# Patient Record
Sex: Male | Born: 1990 | Race: White | Hispanic: No | Marital: Single | Smoking: Never smoker
Health system: Southern US, Community
[De-identification: ages and names within clinical notes are randomized; demographics above are authoritative.]

## PROBLEM LIST (undated history)

## (undated) DIAGNOSIS — F329 Major depressive disorder, single episode, unspecified: Secondary | ICD-10-CM

## (undated) DIAGNOSIS — J45909 Unspecified asthma, uncomplicated: Secondary | ICD-10-CM

## (undated) DIAGNOSIS — F32A Depression, unspecified: Secondary | ICD-10-CM

## (undated) DIAGNOSIS — F419 Anxiety disorder, unspecified: Secondary | ICD-10-CM

## (undated) DIAGNOSIS — J302 Other seasonal allergic rhinitis: Secondary | ICD-10-CM

## (undated) HISTORY — DX: Anxiety disorder, unspecified: F41.9

## (undated) HISTORY — DX: Depression, unspecified: F32.A

---
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
## 2010-03-09 HISTORY — PX: APPENDECTOMY: SHX54

## 2011-01-15 ENCOUNTER — Other Ambulatory Visit: Payer: Self-pay | Admitting: Pediatrics

## 2011-01-15 ENCOUNTER — Ambulatory Visit
Admission: RE | Admit: 2011-01-15 | Discharge: 2011-01-15 | Disposition: A | Discharge status: Home / Self Care | Payer: BC Managed Care – PPO | Source: Ambulatory Visit | Attending: Pediatrics | Admitting: Pediatrics

## 2011-01-15 DIAGNOSIS — R05 Cough: Secondary | ICD-10-CM

## 2011-01-15 DIAGNOSIS — R059 Cough, unspecified: Secondary | ICD-10-CM

## 2011-04-13 ENCOUNTER — Ambulatory Visit
Admission: RE | Admit: 2011-04-13 | Discharge: 2011-04-13 | Disposition: A | Discharge status: Home / Self Care | Payer: BC Managed Care – PPO | Source: Ambulatory Visit | Attending: Pediatrics | Admitting: Pediatrics

## 2011-04-13 ENCOUNTER — Other Ambulatory Visit: Payer: Self-pay | Admitting: Pediatrics

## 2011-04-13 DIAGNOSIS — R0989 Other specified symptoms and signs involving the circulatory and respiratory systems: Secondary | ICD-10-CM

## 2012-10-10 ENCOUNTER — Ambulatory Visit
Admission: RE | Admit: 2012-10-10 | Discharge: 2012-10-10 | Disposition: A | Discharge status: Home / Self Care | Payer: BC Managed Care – PPO | Source: Ambulatory Visit | Attending: Allergy and Immunology | Admitting: Allergy and Immunology

## 2012-10-10 ENCOUNTER — Other Ambulatory Visit: Payer: Self-pay | Admitting: Allergy and Immunology

## 2012-10-10 DIAGNOSIS — J45909 Unspecified asthma, uncomplicated: Secondary | ICD-10-CM

## 2014-04-06 IMAGING — CR DG CHEST 2V
2 series · 2 of 2 positions shown · non-contrast
Comparison: Chest x-ray of 04/13/2011

CLINICAL DATA: Cough, short of breath, asthma

CHEST - 2 VIEW

[w chest pa]
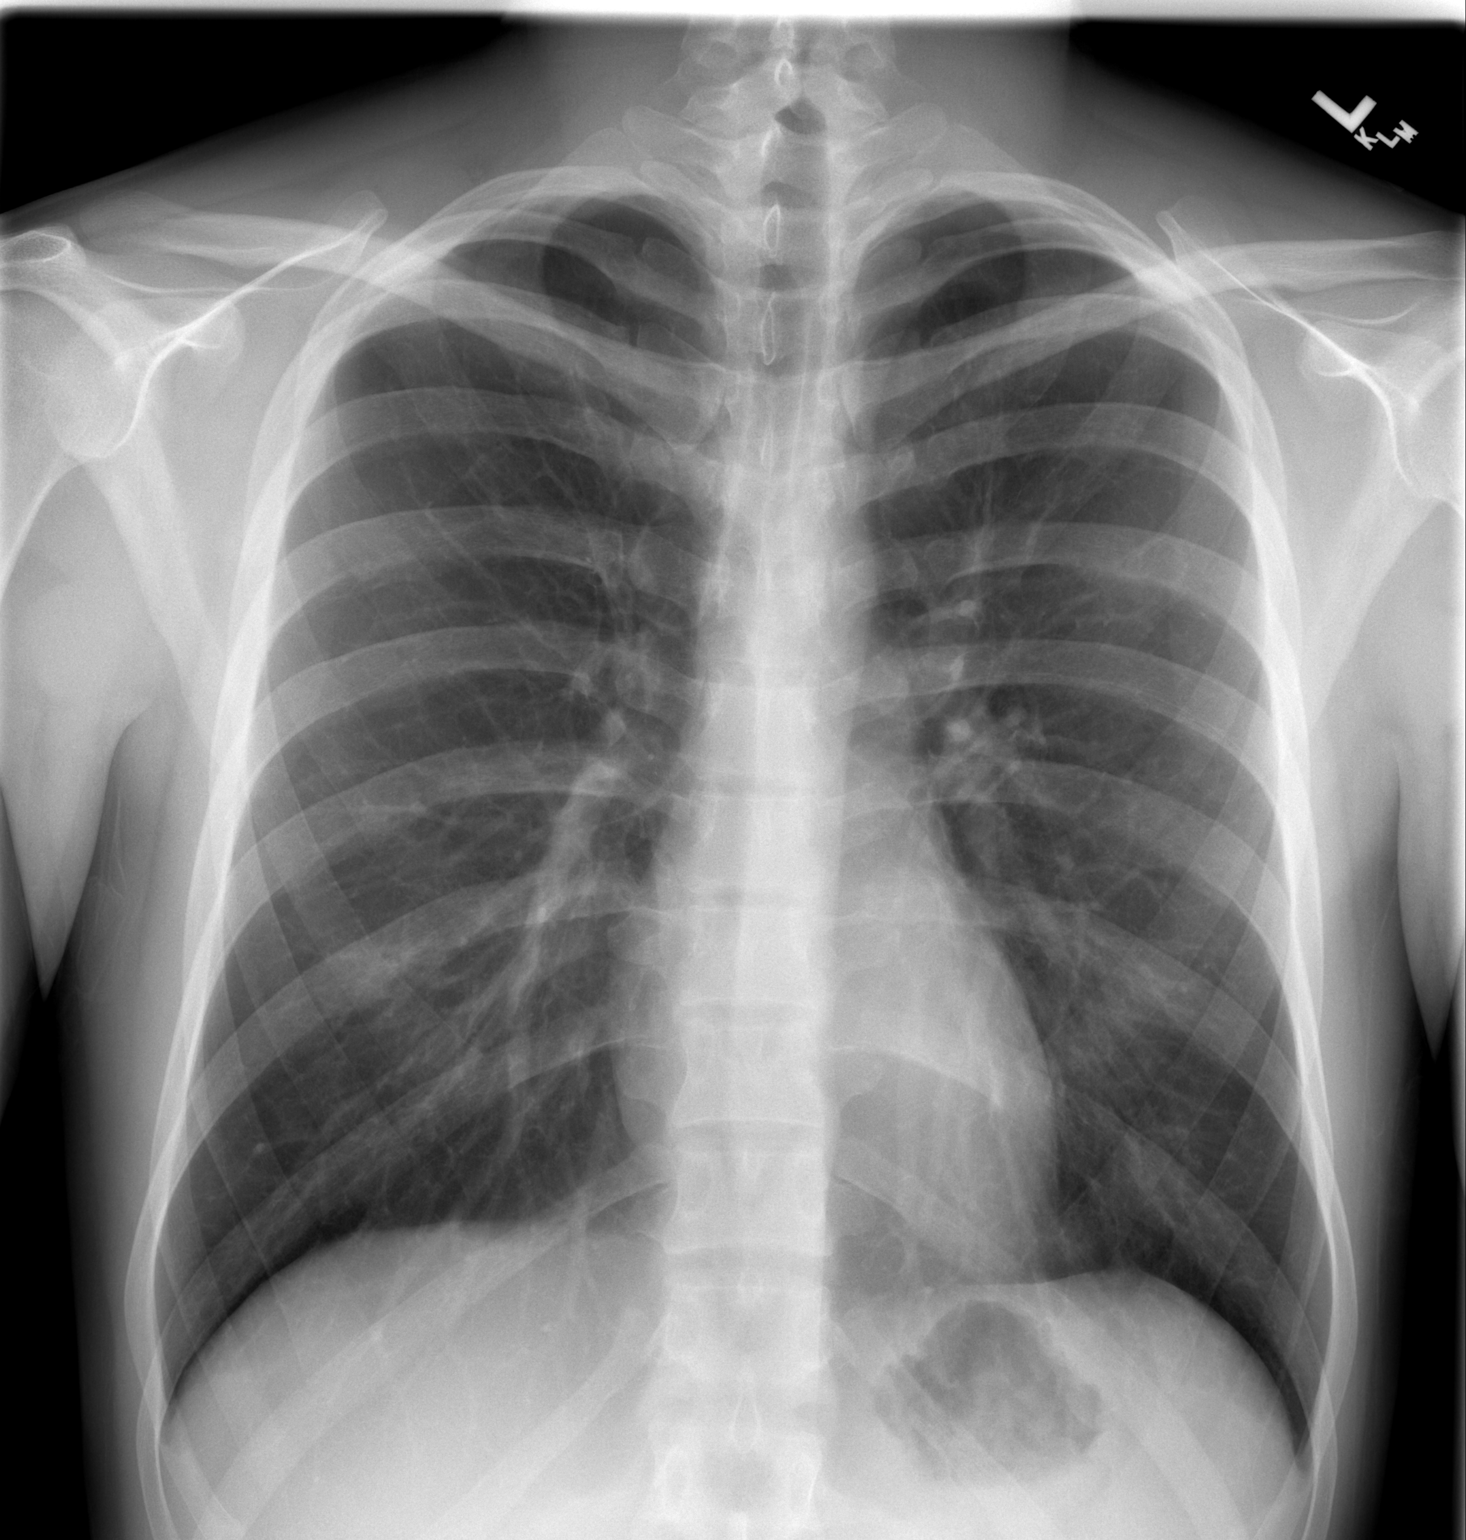

[w chest lat]
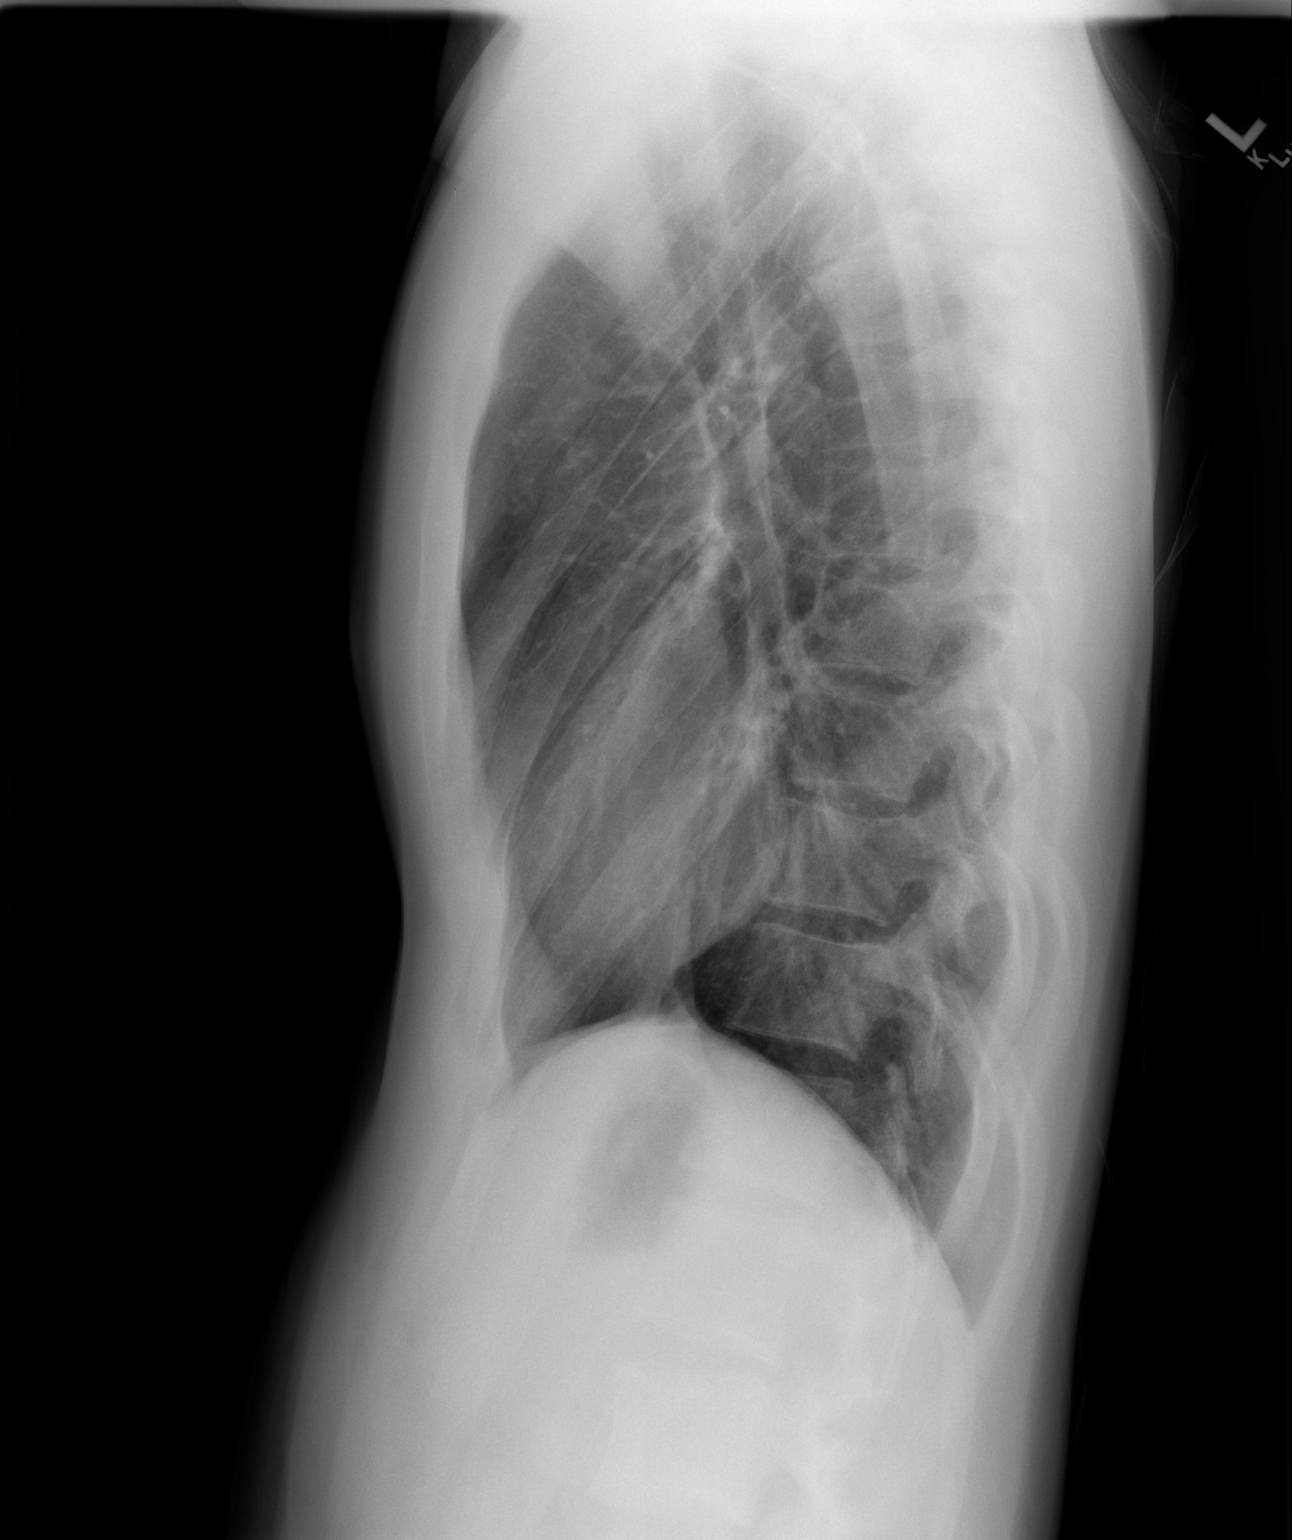

[2 of 2 positions shown; findings below may reference images not displayed]

FINDINGS: The lungs remain clear.  Mediastinal contours are stable.
No adenopathy is seen.  The heart is within normal limits in size.
No bony abnormality is noted.
IMPRESSION: No active lung disease.  Stable chest x-ray.

## 2018-11-10 ENCOUNTER — Other Ambulatory Visit: Payer: Self-pay | Admitting: Behavioral Health

## 2018-11-10 ENCOUNTER — Inpatient Hospital Stay (HOSPITAL_COMMUNITY)
Admission: RE | Admit: 2018-11-10 | Discharge: 2018-11-10 | DRG: 880 | Disposition: A | Discharge status: Home / Self Care | Payer: BLUE CROSS/BLUE SHIELD | Attending: Psychiatry | Admitting: Psychiatry

## 2018-11-10 ENCOUNTER — Other Ambulatory Visit: Payer: Self-pay

## 2018-11-10 ENCOUNTER — Encounter (HOSPITAL_COMMUNITY): Payer: Self-pay | Admitting: Behavioral Health

## 2018-11-10 DIAGNOSIS — U071 COVID-19: Secondary | ICD-10-CM | POA: Diagnosis present

## 2018-11-10 DIAGNOSIS — R45851 Suicidal ideations: Principal | ICD-10-CM | POA: Diagnosis present

## 2018-11-10 HISTORY — DX: Unspecified asthma, uncomplicated: J45.909

## 2018-11-10 HISTORY — DX: Other seasonal allergic rhinitis: J30.2

## 2018-11-10 LAB — SARS CORONAVIRUS 2 BY RT PCR (HOSPITAL ORDER, PERFORMED IN ~~LOC~~ HOSPITAL LAB): SARS Coronavirus 2: POSITIVE — AB

## 2018-11-10 MED ORDER — BUPROPION HCL ER (XL) 150 MG PO TB24: 150.0000 mg | ORAL_TABLET | Freq: Every day | ORAL | 0 refills | Status: DC

## 2018-11-10 MED ORDER — TRAZODONE HCL 50 MG PO TABS: 50.0000 mg | ORAL_TABLET | Freq: Every day | ORAL | 0 refills | Status: DC

## 2018-11-10 MED ORDER — BUPROPION HCL ER (XL) 150 MG PO TB24: 150.0000 mg | ORAL_TABLET | Freq: Every day | ORAL | Status: DC

## 2018-11-10 MED ORDER — HYDROXYZINE HCL 25 MG PO TABS: 25.0000 mg | ORAL_TABLET | Freq: Three times a day (TID) | ORAL | 0 refills | Status: DC | PRN

## 2018-11-10 MED ORDER — HYDROXYZINE HCL 25 MG PO TABS: 25.0000 mg | ORAL_TABLET | Freq: Three times a day (TID) | ORAL | Status: DC | PRN

## 2018-11-10 MED ORDER — TRAZODONE HCL 50 MG PO TABS: 50.0000 mg | ORAL_TABLET | Freq: Every day | ORAL | Status: DC

## 2018-11-10 NOTE — Tx Team (Signed)
Initial Treatment Plan 11/10/2018 5:34 PM SHARBEL SAHAGUN BOF:[id number]    PATIENT STRESSORS: Educational concerns Other: Ongoing depression and complicated relationship.   PATIENT STRENGTHS: Average or above average intelligence Capable of independent living General fund of knowledge Motivation for treatment/growth   PATIENT IDENTIFIED PROBLEMS: Depression   Anxiety  Thoughts of wanting to die but no suicidal ideation.  "I wish I was dead but don't want to hurt myself, I didn't think that I would be admitted."  "Start on some antidepressants"             DISCHARGE CRITERIA:  Improved stabilization in mood, thinking, and/or behavior Need for constant or close observation no longer present Verbal commitment to aftercare and medication compliance  PRELIMINARY DISCHARGE PLAN: Outpatient therapy Medication management  PATIENT/FAMILY INVOLVEMENT: This treatment plan has been presented to and reviewed with the patient.  The patient and family have been given the opportunity to ask questions and make suggestions.  Windell Moment, RN 11/10/2018, 5:34 PM

## 2018-11-10 NOTE — Progress Notes (Signed)
Kirk Leon is a 28 year old make being admitted voluntarily to [facility] as a walk in to [facility].  He came in with ongoing depression with passive suicidal thoughts that have worsened in the past few weeks.  He was pleasant and cooperative.  He voiced that he just wishes something will happen and he would die but no plans to harm himself at this time.  He will seek out staff if that worsens.  Oriented him to the unit.  Admission paperwork completed and signed. Q 15 minute checks initiated for safety.  We will continue to monitor the progress towards his goals.

## 2018-11-10 NOTE — Discharge Instructions (Signed)
You COVID-19 test came back with a positive result. You are asymptomatic. You are being instructed to isolate in your home for 14 days. Please return to the emergency room if you begin to show signs or symptoms; cough, body aches, difficulty breathing, shortness of breath, sore throat, loss of taste or smell, and fever.   Follow up with Dr Casimiro Needle in 3 - 5 days. Let Dr Casimiro Needle know if you are having any difficulties with your medications  For your mental health needs, you are advised to follow up with [facility].  New and returning patients are seen at their walk-in clinic.  Walk-in hours are Monday - Friday from 8:00 am - 3:00 pm.  Walk-in patients are seen on a first come, first served basis.  Try to arrive as early as possible for he best chance of being seen the same day:       [facility]      [address]      [place], [place] [address]      [phone]

## 2018-11-10 NOTE — Discharge Summary (Addendum)
Physician Discharge Summary Note  Patient:  Kirk Leon is an 28 y.o., male MRN:  [id number] DOB:  1990-09-14 Patient phone:  [phone] (home)  Patient address:   [address] [place] [address],  Total Time spent with patient: 30 minutes  Date of Admission:  11/10/2018 Date of Discharge: 11/10/2018  Reason for Admission:  Suicidal thoughts   Principal Problem: Suicidal ideation Discharge Diagnoses: Principal Problem:   Suicidal ideation   Past Psychiatric History: Depression  Past Medical History:  Past Medical History:  Diagnosis Date  . Asthma   . Seasonal allergies    History reviewed. No pertinent surgical history. Family History: History reviewed. No pertinent family history. Family Psychiatric  History: Pt did not give this information Social History:  Social History   Substance and Sexual Activity  Alcohol Use Yes   Comment: 4-5 mixed drinks 1-2 weekly     Social History   Substance and Sexual Activity  Drug Use Yes  . Types: Marijuana   Comment: uses on occasion    Social History   Socioeconomic History  . Marital status: Single    Spouse name: Not on file  . Number of children: Not on file  . Years of education: Not on file  . Highest education level: Not on file  Occupational History  . Not on file  Social Needs  . Financial resource strain: Not on file  . Food insecurity    Worry: Not on file    Inability: Not on file  . Transportation needs    Medical: Not on file    Non-medical: Not on file  Tobacco Use  . Smoking status: Never Smoker  . Smokeless tobacco: Never Used  Substance and Sexual Activity  . Alcohol use: Yes    Comment: 4-5 mixed drinks 1-2 weekly  . Drug use: Yes    Types: Marijuana    Comment: uses on occasion  . Sexual activity: Not on file  Lifestyle  . Physical activity    Days per week: Not on file    Minutes per session: Not on file  . Stress: Not on file  Relationships  . Social Wellsite geologist on phone: Not on file    Gets together: Not on file    Attends religious service: Not on file    Active member of club or organization: Not on file    Attends meetings of clubs or organizations: Not on file    Relationship status: Not on file  Other Topics Concern  . Not on file  Social History Narrative  . Not on file    Hospital Course:   Pt presented to [facility], as a walk-in, with thoughts of self harm. Pt since is denying these thoughts and is requesting to be discharged. His COVID-19 test returned a positive result but he is asymptomatic. He will be discharged home with instructions to self-isolate for 14 days. He is depressed but wants to follow up outpatient. Pt instructed to return to emergency room if he begins to show signs or symptoms of COVID. This information has been placed in his discharge instructions along with outpatient providers. He is able to contract for safety. Of note; he is dealing with the stress of his mother being sick with leukemia.   Addendum: This Clinical research associate spoke with Pt's father about options available to Pt for outpatient treatment. [facility] outpatient information was provided. Pt had been to see Dr Donell Beers yesterday for his  initial intake and evaluation.  Dr. Donell BeersPlovsky found patient to be extremely suicidal and recommended he come to behavioral health hospital for additional evaluation.  Patient was found to be COVID positive, with no symptoms,  at which time he was denying suicidal ideation intent or plan.  This Clinical research associatewriter spoke with Dr. Donell BeersPlovsky on the phone and we came to an agreement to prescribe a short course of trazodone, Vistaril, and Wellbutrin.  Patient will return to see Dr. Donell BeersPlovsky on Tuesday for continued care.  Patient's father was educated regarding medication administration.  Patient was also educated regarding the medications Dr. Donell BeersPlovsky would like him to begin taking before his appointment on Tuesday.  Patient was discharged in stable condition  to the care of his father.  Father and patient were both given strict return precautions; [facility], the nearest emergency room or call 911.  Physical Findings: AIMS: Facial and Oral Movements Muscles of Facial Expression: None, normal Lips and Perioral Area: None, normal Jaw: None, normal Tongue: None, normal,Extremity Movements Upper (arms, wrists, hands, fingers): None, normal Lower (legs, knees, ankles, toes): None, normal, Trunk Movements Neck, shoulders, hips: None, normal, Overall Severity Severity of abnormal movements (highest score from questions above): None, normal Incapacitation due to abnormal movements: None, normal Patient's awareness of abnormal movements (rate only patient's report): No Awareness, Dental Status Current problems with teeth and/or dentures?: No Does patient usually wear dentures?: No  CIWA:    COWS:     Musculoskeletal: Strength & Muscle Tone: within normal limits Gait & Station: normal Patient leans: N/A  Psychiatric Specialty Exam:   Blood pressure (!) 129/92, pulse 99, temperature 98.2 F (36.8 C), temperature source Oral, resp. rate 18, SpO2 99 %.There is no height or weight on file to calculate BMI.  General Appearance: Casual  Eye Contact::  Good  Speech:  Clear and Coherent409  Volume:  Normal  Mood:  Euthymic  Affect:  Congruent  Thought Process:  Coherent and Descriptions of Associations: Intact  Orientation:  Full (Time, Place, and Person)  Thought Content:  Logical  Suicidal Thoughts:  No  Homicidal Thoughts:  No  Memory:  Immediate;   Good Recent;   Good Remote;   Fair  Judgement:  Fair  Insight:  Fair  Psychomotor Activity:  Normal  Concentration:  Good  Recall:  Good  Fund of Knowledge:Good  Language: Good  Akathisia:  Negative  Handed:  Right  AIMS (if indicated):     Assets:  ArchitectCommunication Skills Financial Resources/Insurance Housing  Sleep:     Cognition: WNL  ADL's:  Intact     Have you used any form  of tobacco in the last 30 days? (Cigarettes, Smokeless Tobacco, Cigars, and/or Pipes): No  Has this patient used any form of tobacco in the last 30 days? (Cigarettes, Smokeless Tobacco, Cigars, and/or Pipes) N/A  Blood Alcohol level:  No results found for: [facility]  Metabolic Disorder Labs:  No results found for: HGBA1C, MPG No results found for: PROLACTIN No results found for: CHOL, TRIG, HDL, CHOLHDL, VLDL, LDLCALC  See Psychiatric Specialty Exam and Suicide Risk Assessment completed by Attending Physician prior to discharge.  Discharge destination:  Home  Is patient on multiple antipsychotic therapies at discharge:  No   Has Patient had three or more failed trials of antipsychotic monotherapy by history:  No  Recommended Plan for Multiple Antipsychotic Therapies: NA   Allergies as of 11/10/2018      Reactions   Sulfa Antibiotics Hives  Medication List    You have not been prescribed any medications.      Follow-up recommendations:  Activity:  as tolerated Diet:  Heart Healthy  Comments and Treatment Plan:  Take all medications as prescribed by your outpatient provider Keep all follow-up appointments as scheduled. Outpatient mental health providers have been place din your discharge instructions, please make an appointment for your depression for medication management.  Do not consume alcohol or use illegal drugs while on prescription medications. Report any adverse effects from your medications to your primary care provider promptly.  In the event of recurrent symptoms or worsening symptoms, call 911, a crisis hotline, or go to the nearest emergency department for evaluation.   Signed: Ethelene Hal, NP 11/10/2018, 6:41 PM
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
## 2018-11-10 NOTE — H&P (Signed)
Behavioral Health Medical Screening Exam  Kirk Leon is an 28 y.o. male.who presents to [facility] as a walk-in, voluntarily. Patient was referred to [facility] by his outpatient psychiatrist Dr. Gwenlyn Perking.  He at current endorses worsening depression and suicidal thoughts. He denies plan or intent although reports he, almost daily, think about, " life would be easier if I wasn't alive." He reports being diagnosed with depression 10 years ago. Describes current depressive symptoms as  Feelings of hopelessness, worthlessness, decreased energy and concentration, fluctuations in sleeping hours, and decreased appetite.  He states his most recent stressors are starting online law school and this past Saturday, a break-up with his girlfriend. Reports he flew out to [place] last Friday where his girlfriend resides and even on his flight there, he had thoughts of, " I wish the plane would crash."  He states, " at night I am always hoping that I don't wake up the next morning."  He endorses anxiety described as excessive worry. He denies any history of a panic disorder.  He denies homicidal ideations or psychosis. Reports having two therapist, one in [place], [place], and another in [place]. He reports he has struggled with, " off and on" suicidal thoughts for the past 10 years.. He denies any previous suicide attempts or self harming behaviors. Reports smoking marijuana weekly, occasional use of alcohol, and using "molly."  Reports he has not used molly in one month. He denies other substance abuse or use. He states he has never been on any psychotropic medications although reports he is open to starting.   Total Time spent with patient: 20 minutes  Psychiatric Specialty Exam: Physical Exam  Vitals reviewed. Constitutional: He is oriented to person, place, and time.  Neurological: He is alert and oriented to person, place, and time.    Review of Systems  Psychiatric/Behavioral: Positive for depression and suicidal ideas.  Negative for hallucinations. The patient is nervous/anxious and has insomnia.     There were no vitals taken for this visit.There is no height or weight on file to calculate BMI.  General Appearance: Fairly Groomed  Eye Contact:  Good  Speech:  Clear and Coherent and Normal Rate  Volume:  Decreased  Mood:  Anxious, Depressed, Hopeless and Worthless  Affect:  Depressed and Flat  Thought Process:  Coherent, Goal Directed, Linear and Descriptions of Associations: Intact  Orientation:  Full (Time, Place, and Person)  Thought Content:  Logical  Suicidal Thoughts:  Yes.  without intent/plan  Homicidal Thoughts:  No  Memory:  Immediate;   Fair Recent;   Fair  Judgement:  Fair  Insight:  Fair  Psychomotor Activity:  Normal  Concentration: Concentration: Fair and Attention Span: Fair  Recall:  [employer] of Knowledge:Fair  Language: Good  Akathisia:  NA  Handed:  Right  AIMS (if indicated):     Assets:  Communication Skills Desire for Improvement Resilience Social Support  Sleep:       Musculoskeletal: Strength & Muscle Tone: within normal limits Gait & Station: normal Patient leans: N/A  There were no vitals taken for this visit.  Recommendations:  Based on my evaluation the patient does not appear to have an emergency medical condition.   There is evidence of imminent risk to self at present.   Patient does meet criteria for psychiatric inpatient admission which is recommended at this time.  Mordecai Maes, NP 11/10/2018, 1:19 PM

## 2018-11-10 NOTE — [facility] Suicide Risk Assessment (Cosign Needed)
Suicide Risk Assessment  Discharge Assessment   [facility] Discharge Suicide Risk Assessment   Principal Problem: Suicidal ideation Discharge Diagnoses: Principal Problem:   Suicidal ideation   Total Time spent with patient: 30 minutes  Musculoskeletal: Strength & Muscle Tone: within normal limits Gait & Station: normal Patient leans: N/A  Psychiatric Specialty Exam:   Blood pressure (!) 129/92, pulse 99, temperature 98.2 F (36.8 C), temperature source Oral, resp. rate 18, SpO2 99 %.There is no height or weight on file to calculate BMI.  General Appearance: Casual  Eye Contact::  Good  Speech:  Clear and Coherent409  Volume:  Normal  Mood:  Euthymic  Affect:  Congruent  Thought Process:  Coherent and Descriptions of Associations: Intact  Orientation:  Full (Time, Place, and Person)  Thought Content:  Logical  Suicidal Thoughts:  No  Homicidal Thoughts:  No  Memory:  Immediate;   Good Recent;   Good Remote;   Fair  Judgement:  Fair  Insight:  Fair  Psychomotor Activity:  Normal  Concentration:  Good  Recall:  Good  Fund of Knowledge:Good  Language: Good  Akathisia:  Negative  Handed:  Right  AIMS (if indicated):     Assets:  Agricultural consultant Housing  Sleep:     Cognition: WNL  ADL's:  Intact   Mental Status Per Nursing Assessment::   On Admission: Pt presented to [facility], as a walk-in, with thoughts of self harm. Pt since is denying these thoughts and is requesting to be discharged. His COVID-19 test returned a positive result but he is asymptomatic. He will be discharged home with instructions to self-isolate for 14 days. He is depressed but wants to follow up outpatient. Pt instructed to return to emergency room if he begins to show signs or symptoms of COVID. This information has been placed in his discharge instructions along with outpatient providers. He is able to contract for safety. Of note; he is dealing with the stress of his  mother being sick with leukemia.   Demographic Factors:  Male and Adolescent or young adult  Loss Factors: Financial problems/change in socioeconomic status  Historical Factors: Family history of mental illness or substance abuse  Risk Reduction Factors:   Sense of responsibility to family  Continued Clinical Symptoms:  Depression:   Impulsivity  Cognitive Features That Contribute To Risk:  Closed-mindedness    Suicide Risk:  Minimal: No identifiable suicidal ideation.  Patients presenting with no risk factors but with morbid ruminations; may be classified as minimal risk based on the severity of the depressive symptoms    Plan Of Care/Follow-up recommendations:  Activity:  as tolerated Diet:  Heart healthy  Ethelene Hal, NP 11/10/2018, 6:33 PM

## 2018-11-11 ENCOUNTER — Telehealth (HOSPITAL_COMMUNITY): Payer: Self-pay | Admitting: Psychiatry

## 2018-11-11 ENCOUNTER — Telehealth: Payer: Self-pay | Admitting: *Deleted

## 2018-11-11 ENCOUNTER — Telehealth (HOSPITAL_COMMUNITY): Payer: Self-pay | Admitting: Professional

## 2018-11-11 NOTE — Telephone Encounter (Signed)
D:  Pt's father called and left vm for someone to call him as soon as possible.  States he was trying to reach [employer].  A:  Returned pt's father call.  According to father, pt was seen by Dr. Casimiro Needle yesterday and sent to TTS for admission.  Apparently, after assessment, he was going to be admitted for 3-5 days; but tested positive for COVID.  Mr. Winchell said he was able to talk the staff into letting him take his son (pt) home to be in quarantine.  States he was given two prescriptions for his son.  Pt's father is inquiring about virtual groups.  A:  Will contact Beather Arbour, RNC and PHP.

## 2018-11-11 NOTE — Telephone Encounter (Signed)
Contacted by Helene Kelp, RN from [facility] Dept requesting contact information; pt address on file given: [address] [place], [place], and POC Kermit Arnette (father) [phone]; she verbalized understanding.

## 2018-11-11 NOTE — Telephone Encounter (Signed)
Cln called pt to discuss PHP. Cln orients pt to PHP and pt reports wanting to move forward with an ax. Pt states "I've been depressed for 10 years, never been on anti-depressants before today. I'm exhausted from pretending I'm ok. It goes in waves but I've had a lot happen recently. I just feel like I can't go anymore." Pt reports Some substance use: I smoke marijuana socially 1-2x a week; drink socially 2x a week 3-5 beers or cocktails; extacy 1.5 months ago and Cloyde Reams; tried shrooms (09/09/17) and acid (2014) only 1 time. No current plan/intent but endorses passive SI; "I would be OK not waking up." No HI/AVH. Pt identifies mother, father, other family, and friends as safety factors. Pt reports he has an apt with Dr. Casimiro Needle on Tuesday. Cln explains dual treatment and insurance may not cover both appointments and offers apt on Wednesday. Pt accepts apt on Wednesday and changes mind to do apt on Tuesday at 2. Paperwork will be emailed to [email or web address] . Cln informs pt that [name] will complete ax and pw needs to be send back to both clns. Pt understands.

## 2018-11-15 ENCOUNTER — Other Ambulatory Visit: Payer: Self-pay

## 2018-11-15 ENCOUNTER — Other Ambulatory Visit (HOSPITAL_COMMUNITY): Payer: BLUE CROSS/BLUE SHIELD | Attending: Licensed Clinical Social Worker | Admitting: Licensed Clinical Social Worker

## 2018-11-15 DIAGNOSIS — Z658 Other specified problems related to psychosocial circumstances: Secondary | ICD-10-CM | POA: Diagnosis not present

## 2018-11-15 DIAGNOSIS — J45909 Unspecified asthma, uncomplicated: Secondary | ICD-10-CM | POA: Insufficient documentation

## 2018-11-15 DIAGNOSIS — Z818 Family history of other mental and behavioral disorders: Secondary | ICD-10-CM | POA: Diagnosis not present

## 2018-11-15 DIAGNOSIS — R4589 Other symptoms and signs involving emotional state: Secondary | ICD-10-CM | POA: Insufficient documentation

## 2018-11-15 DIAGNOSIS — Z882 Allergy status to sulfonamides status: Secondary | ICD-10-CM | POA: Diagnosis not present

## 2018-11-15 DIAGNOSIS — F411 Generalized anxiety disorder: Secondary | ICD-10-CM | POA: Diagnosis not present

## 2018-11-15 DIAGNOSIS — Z79899 Other long term (current) drug therapy: Secondary | ICD-10-CM | POA: Insufficient documentation

## 2018-11-15 DIAGNOSIS — R45851 Suicidal ideations: Secondary | ICD-10-CM | POA: Insufficient documentation

## 2018-11-15 DIAGNOSIS — F332 Major depressive disorder, recurrent severe without psychotic features: Secondary | ICD-10-CM | POA: Insufficient documentation

## 2018-11-15 NOTE — Psych (Signed)
Virtual Visit via Video Note  I connected with Kirk Leon on 11/15/18 at  2:00 PM EDT by a video enabled telemedicine application and verified that I am speaking with the correct person using two identifiers.   I discussed the limitations of evaluation and management by telemedicine and the availability of in person appointments. The patient expressed understanding and agreed to proceed.  I discussed the assessment and treatment plan with the patient. The patient was provided an opportunity to ask questions and all were answered. The patient agreed with the plan and demonstrated an understanding of the instructions.   The patient was advised to call back or seek an in-person evaluation if the symptoms worsen or if the condition fails to improve as anticipated.  I provided 75 minutes of non-face-to-face time during this encounter.   Donia GuilesJenny Jerine Surles, LCSW  Comprehensive Clinical Assessment (CCA) Note  11/15/2018 Kirk Abundrew J Biddinger [id number]  Visit Diagnosis:      ICD-10-CM   1. Severe episode of recurrent major depressive disorder, without psychotic features (HCC)  F33.2   2. GAD (generalized anxiety disorder)  F41.1       CCA Part One  Part One has been completed on paper by the patient.  (See scanned document in Chart Review)  CCA Part Two A  Intake/Chief Complaint:  CCA Intake With Chief Complaint CCA Part Two Date: 11/15/18 CCA Part Two Time: 1400 Chief Complaint/Presenting Problem: [facility] the recommendation of psychiatrist, Dr Kirk Leon. Pt states he expressed suicidal thoughts to his father, wishes that the plane pt he was due to be on would crash and he would die. Pt was [place] from [place] to where [place] in [place]. Pt reports his father had him fly home to [place] instead and his father arranged for him to see the psychiatrist. Pt reports "a build up of things" over the past month and a half led to where he is now. Pt  shares he went [place] and felt "truly happy" consistently for the first time in approximately 10 years. Pt reports he had been feeling "numb" and confusing it for "being okay" and the trip showed him he was wrong. Pt reports conflicting issues with an off and on again girlfriend. Pt identifies that when he had his trip revelation he put his energy into his relationship instead, to distract him. Last week he went to visit his girlfriend in [place] and she broke up with him the day he arrived . Pt shares that without the distraction of the relationship everything came "crashing in" and he got into a "dark hole." Pt reports realizing he has been depressed for 10 years and overall unfulfilled in life. Pt states additional complication of his mom being sick with chronic leukemia. Pt shares he goes to bed most nights hoping he does not wake up. Pt reports he thinks of suicide daily and this is not new for him and states he does not think about acting on his thoughts and has deterrents of his friends and family, who he would not want to cause pain to. [facility] due to denial of SI and testing positive for COVID and desiring to quarantine at home. Pt reports [facility] and has been taking it and denies negative side effects. Patients Currently Reported Symptoms/Problems: Pt reports depressed mood, lethargy, lack of motivation, rumination,  decrease in appetite, passive SI, and panic attacks with symptoms of shortness of breath, heart palipitations, racing thoughts, and feeling warm Individual's Strengths: Pt has support in family and friends, values those relationships, has some history of therapy, willing to engage in treatment Type of Services Patient Feels Are Needed: intensive treatment  Mental Health Symptoms Depression:  Depression: Change in energy/activity, Fatigue, Hopelessness, Increase/decrease in appetite,  Worthlessness, Difficulty Concentrating  Mania:  Mania: N/A  Anxiety:   Anxiety: Difficulty concentrating, Fatigue, Restlessness, Tension, Worrying  Psychosis:  Psychosis: N/A  Trauma:  Trauma: N/A  Obsessions:  Obsessions: N/A  Compulsions:  Compulsions: N/A  Inattention:  Inattention: N/A  Hyperactivity/Impulsivity:  Hyperactivity/Impulsivity: N/A  Oppositional/Defiant Behaviors:  Oppositional/Defiant Behaviors: N/A  Borderline Personality:  Emotional Irregularity: Chronic feelings of emptiness, Intense/unstable relationships  Other Mood/Personality Symptoms:      Mental Status Exam Appearance and self-care  Stature:  Stature: Average  Weight:  Weight: Average weight  Clothing:  Clothing: Casual  Grooming:  Grooming: Normal  Cosmetic use:  Cosmetic Use: None  Posture/gait:  Posture/Gait: Normal  Motor activity:  Motor Activity: Not Remarkable  Sensorium  Attention:  Attention: Normal  Concentration:  Concentration: Anxiety interferes  Orientation:  Orientation: X5  Recall/memory:  Recall/Memory: Normal  Affect and Mood  Affect:  Affect: Depressed  Mood:  Mood: Depressed  Relating  Eye contact:  Eye Contact: Normal  Facial expression:  Facial Expression: Depressed  Attitude toward examiner:  Attitude Toward Examiner: Cooperative  Thought and Language  Speech flow: Speech Flow: Normal  Thought content:  Thought Content: Appropriate to mood and circumstances  Preoccupation:     Hallucinations:     Organization:     Transport planner of Knowledge:  Fund of Knowledge: Average  Intelligence:  Intelligence: Average  Abstraction:  Abstraction: Normal  Judgement:  Judgement: Fair  Art therapist:  Reality Testing: Realistic  Insight:  Insight: Fair  Decision Making:  Decision Making: Normal  Social Functioning  Social Maturity:     Social Judgement:     Stress  Stressors:  Stressors: Transitions  Coping Ability:  Coping Ability: Research officer, political party Deficits:      Supports:      Family and Psychosocial History: Family history Marital status: Single Does patient have children?: No  Childhood History:  Childhood History By whom was/is the patient raised?: Both parents Patient's description of current relationship with people who raised him/her: Pt reports parents are supportive Does patient have siblings?: Yes Number of Siblings: 2 Description of patient's current relationship with siblings: Pt has 2 younger adult sisters. He reports they get along okay. One lives in the home where he is currently staying, the other is in [place] Did patient suffer any verbal/emotional/physical/sexual abuse as a child?: No Did patient suffer from severe childhood neglect?: No Has patient ever been sexually abused/assaulted/raped as an adolescent or adult?: No Was the patient ever a victim of a crime or a disaster?: No Witnessed domestic violence?: No Has patient been effected by domestic violence as an adult?: No  CCA Part Two B  Employment/Work Situation: Employment / Work Copywriter, advertising Employment situation: Radio broadcast assistant job has been impacted by current illness: Yes Describe how patient's job has been impacted: Pt has medically withdrawn from school due to sx Did You Receive Any Psychiatric Treatment/Services While in the [employer]?: No Are There Guns or Other Weapons in [place]?: No  Education: Education School Currently Attending: [employer](Pt was 3 weeks into online classes and medically  withdrew due to sx) Did You Graduate From [employer]?: Yes Did You Attend College?: Yes What Type of College Degree Do you Have?: Bachelor's - Journalism Did You Attend Graduate School?: Yes What is Your Post Graduate Degree?: Pt is enrolled in law school and medically withdrew 3 weeks into 1st semester Did You Have An Individualized Education Program (IIEP): No Did You Have Any Difficulty At [employer]?: No  Religion: Religion/Spirituality Are You A Religious  Person?: No  Leisure/Recreation: Leisure / Recreation Leisure and Hobbies: Pt reports spending time with friends, music, and being outside  Exercise/Diet: Exercise/Diet Do You Exercise?: No Do You Follow a Special Diet?: No Do You Have Any Trouble Sleeping?: Yes Explanation of Sleeping Difficulties: anxiety interfering  CCA Part Two C  Alcohol/Drug Use: Alcohol / Drug Use Pain Medications: see MAR Prescriptions: Trazodone, Wellbutrin, Vistaril Over the Counter: see MAR History of alcohol / drug use?: Yes Longest period of sobriety (when/how long): none reported Substance #1 Name of Substance 1: alcohol 1 - Age of First Use: 18 1 - Amount (size/oz): 4-5 drinks 1 - Frequency: 1-2 times weekly 1 - Duration: unknown 1 - Last Use / Amount: approximately 1 week ago Substance #2 Name of Substance 2: marijuana 2 - Age of First Use: unknown 2 - Amount (size/oz): unknown 2 - Frequency: occasionally 2 - Duration: unknown 2 - Last Use / Amount: approximately 5 days ago                  CCA Part Three  ASAM's:  Six Dimensions of Multidimensional Assessment  Dimension 1:  Acute Intoxication and/or Withdrawal Potential:     Dimension 2:  Biomedical Conditions and Complications:     Dimension 3:  Emotional, Behavioral, or Cognitive Conditions and Complications:     Dimension 4:  Readiness to Change:     Dimension 5:  Relapse, Continued use, or Continued Problem Potential:     Dimension 6:  Recovery/Living Environment:      Substance use Disorder (SUD)    Social Function:     Stress:  Stress Stressors: Transitions Coping Ability: Exhausted Patient Takes Medications The Way The Doctor Instructed?: Yes Priority Risk: Moderate Risk  Risk Assessment- Self-Harm Potential: Risk Assessment For Self-Harm Potential Thoughts of Self-Harm: Vague current thoughts Method: No plan Availability of Means: No access/NA Additional Information for Self-Harm Potential:  Preoccupation with Death Additional Comments for Self-Harm Potential: Pt reports chronic passive SI with plan of carbon monoxide poisoning. Pt denies ever having intent, denies any past attempts, and reports deterrent of his friends and family  Risk Assessment -Dangerous to Others Potential: Risk Assessment For Dangerous to Others Potential Method: No Plan Availability of Means: No access or NA Intent: Vague intent or NA Notification Required: No need or identified person  DSM5 Diagnoses: Patient Active Problem List   Diagnosis Date Noted  . Suicidal ideation 11/10/2018    Patient Centered Plan: Patient is on the following Treatment Plan(s):  Depression  Recommendations for Services/Supports/Treatments: Recommendations for Services/Supports/Treatments Recommendations For Services/Supports/Treatments: Partial Hospitalization(Pt will benefit from PHP to increase stabilization from depressed mood and increase in passive SI.)  Treatment Plan Summary:    Referrals to Alternative Service(s): Referred to Alternative Service(s):   Place:   Date:   Time:    Referred to Alternative Service(s):   Place:   Date:   Time:    Referred to Alternative Service(s):   Place:   Date:   Time:    Referred to Alternative Service(s):  Place:   Date:   Time:     Donia GuilesJenny Sunshyne Horvath MSW, LCSW, LCAS

## 2018-11-16 ENCOUNTER — Other Ambulatory Visit: Payer: Self-pay

## 2018-11-16 ENCOUNTER — Other Ambulatory Visit (HOSPITAL_COMMUNITY): Payer: BLUE CROSS/BLUE SHIELD | Admitting: Licensed Clinical Social Worker

## 2018-11-16 DIAGNOSIS — F332 Major depressive disorder, recurrent severe without psychotic features: Secondary | ICD-10-CM

## 2018-11-16 DIAGNOSIS — F411 Generalized anxiety disorder: Secondary | ICD-10-CM

## 2018-11-16 NOTE — Progress Notes (Signed)
Virtual Visit via Telephone Note  I connected with Kirk Leon on 11/16/18 at  9:00 AM EDT by telephone and verified that I am speaking with the correct person using two identifiers.   I discussed the limitations, risks, security and privacy concerns of performing an evaluation and management service by telephone and the availability of in person appointments. I also discussed with the patient that there may be a patient responsible charge related to this service. The patient expressed understanding and agreed to proceed.    I discussed the assessment and treatment plan with the patient. The patient was provided an opportunity to ask questions and all were answered. The patient agreed with the plan and demonstrated an understanding of the instructions.   The patient was advised to call back or seek an in-person evaluation if the symptoms worsen or if the condition fails to improve as anticipated.  I provided 00 minutes of non-face-to-face time during this encounter.   [name], NP    Behavioral Health Partial Program Assessment Note  Date: 11/16/2018 Name: Kirk Leon   HPI: Kirk Leon is a 28 y.o. Caucasian male presents with depression and suicidal ideations.  Currently denying intent or plan.  Reports a longstanding history with depression and anxiety.  Reports for the past 10 years he has been struggling with depression.  Reports more recently he has experienced a break-up with his girlfriend of 1-1/2 years.  Reports recent diagnosis with COVID.  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  Patient reports feeling " free as he was traveling on the [place]" reports feelings of hopelessness worthlessness.  Reports intermittent bouts of depression.  Patient is followed by [facility] psychiatrist who recently initiated Wellbutrin and trazodone for reported symptoms.  Patient validates information provided in  the below assessment.  Patient was enrolled in partial psychiatric program on 11/16/18.  Primary complaints include: depression worse, feeling depressed and increased irritability.  Onset of symptoms was gradual with gradually worsening course since that time. Psychosocial Stressors include the following: family.   Per assessment note on admisionAndrew LIZANDRO Leon is an 28 y.o. male.who presents to [facility] as a walk-in, voluntarily. Patient was referred to [facility] by his outpatient psychiatrist Dr. Gwenlyn Perking.  He at current endorses worsening depression and suicidal thoughts. He denies plan or intent although reports he, almost daily, think about, " life would be easier if I wasn't alive." He reports being diagnosed with depression 10 years ago. Describes current depressive symptoms as  Feelings of hopelessness, worthlessness, decreased energy and concentration, fluctuations in sleeping hours, and decreased appetite.  He states his most recent stressors are starting online law school and this past Saturday, a break-up with his girlfriend. Reports he flew out to [place] last Friday where his girlfriend resides and even on his flight there, he had thoughts of, " I wish the plane would crash."  He states, " at night I am always hoping that I don't wake up the next morning."  He endorses anxiety described as excessive worry. He denies any history of a panic disorder.  He denies homicidal ideations or psychosis. Reports having two therapist, one in [place], [place], and another in [place]. He reports he has struggled with, " off and on" suicidal thoughts for the past 10 years.. He denies any previous suicide attempts or self harming behaviors. Reports smoking marijuana weekly, occasional use of alcohol, and using "molly."  Reports he  has not used molly in one month. He denies other substance abuse or use. He states he has never been on any psychotropic medications although reports he is open to starting  I have reviewed the  following documentation dated : past psychiatric history and past social and family history.  Denies history of physical or sexual abuse in the past.  Denies family history of mental illness.  Complaints of Pain: nonear Past Psychiatric History:  Drug/alcohol  Currently in treatment with Wellbutrin XL 150 mg patient to titrate to 300 mg daily. Ambien   Substance Abuse History: marijuana, narcotics and "Mollies and Ecstasy"  Use of Alcohol: moderate 2-3 days  Use of Caffeine: denies use Use of over the counter:   No past surgical history on file.  Past Medical History:  Diagnosis Date  . Asthma   . Seasonal allergies    Outpatient Encounter Medications as of 11/16/2018  Medication Sig  . buPROPion (WELLBUTRIN XL) 150 MG 24 hr tablet Take 1 tablet (150 mg total) by mouth daily.  . hydrOXYzine (ATARAX/VISTARIL) 25 MG tablet Take 1 tablet (25 mg total) by mouth 3 (three) times daily as needed for anxiety.  . [DISCONTINUED] traZODone (DESYREL) 50 MG tablet Take 1 tablet (50 mg total) by mouth at bedtime.   No facility-administered encounter medications on file as of 11/16/2018.    Allergies  Allergen Reactions  . Sulfa Antibiotics Hives    Social History   Tobacco Use  . Smoking status: Never Smoker  . Smokeless tobacco: Never Used  Substance Use Topics  . Alcohol use: Yes    Comment: 4-5 mixed drinks 1-2 weekly   Functioning Relationships: good support system and alone & isolated Education: College       Please specify degree: Law school  Other Pertinent History: None No family history on file.   Review of Systems Constitutional: negative  Objective:  There were no vitals filed for this visit.  Physical Exam:   Mental Status Exam: Appearance:  Well groomed Psychomotor::  Within Normal Limits Attention span and concentration: Normal Behavior: calm and cooperative Speech:  normal volume Mood:  depressed and anxious Affect:  blunted and flat Thought Process:   Coherent Thought Content:  Hallucinations: None Orientation:  person and place Cognition:  grossly intact Insight:  Intact Judgment:  Fair Estimate of Intelligence: Average Fund of knowledge: Aware of current events Memory: Recent and remote intact Abnormal movements: None Gait and station:   Assessment:  Diagnosis: No primary diagnosis found. No diagnosis found.  Indications for admission: inpatient care required if not in partial hospital program  Plan: patient enrolled in Partial Hospitalization Program, patient's current medications are to be continued, a comprehensive treatment plan will be developed and side effects of medications have been reviewed with patient  Major depressive disorder: Discussed titration to Wellbutrin 150 to 300 mg p.o. daily starting on 9/10 Will discontinue trazodone 50 mg due to vivid dreams and oversedation Initiated Ambien 25 mg per attending psychiatrist patient receptive to plan -Medications managed by attending psychiatrist- MD [name] -Consider TSH testing after quarantine  Treatment options and alternatives reviewed with patient and patient understands the above plan.  Treatment plan was reviewed and agreed upon by NP T. Berenis Corter inpatient Kirk Leon  need for group services    .   Kirk Rackanika N Yadier Bramhall, NP

## 2018-11-16 NOTE — Psych (Signed)
Virtual Visit via Video Note  I connected with Kirk Leon on 11/16/18 at  9:00 AM EDT by a video enabled telemedicine application and verified that I am speaking with the correct person using two identifiers.   I discussed the limitations of evaluation and management by telemedicine and the availability of in person appointments. The patient expressed understanding and agreed to proceed.  I discussed the assessment and treatment plan with the patient. The patient was provided an opportunity to ask questions and all were answered. The patient agreed with the plan and demonstrated an understanding of the instructions.   The patient was advised to call back or seek an in-person evaluation if the symptoms worsen or if the condition fails to improve as anticipated.  Pt was provided 240 minutes of non-face-to-face time during this encounter.   Kirk GuilesJenny Mazella Deen, LCSW    [facility]  Kirk Abundrew J Cuaresma [id number]  Session Time: 9:00 - 10:00  Participation Level: Active  Behavioral Response: CasualAlertDepressed  Type of Therapy: Group Therapy  Treatment Goals addressed: Coping  Interventions: CBT, DBT, Supportive and Reframing  Summary:  Clinician led check-in regarding current stressors and situation, and review of patient completed daily inventory. Clinician utilized active listening and empathetic response and validated patient emotions. Clinician facilitated processing group on pertinent issues.   Therapist Response: Kirk Leon is a 28 y.o. male who presents with depression and anxiety symptoms. Patient arrived within time allowed and reports that he is feeling "really down." Patient rates his mood at a 4 on a scale of 1-10 with 10 being great. Pt reports he didn't sleep well last night, and stayed up ruminating. Pt shares his evening was "alright" and that he sat outside with his parents for a little bit and didn't do much other than that. Pt states he has  been looking for something to be excited about and wanted to plan another road trip and was disappointed when his parents told him that "wasn't realistic" last night. Pt presents as defeated and has some resistance to trying new strategies. Patient engaged in discussion.      Session Time: 10:00-11:00  Participation Level:Active  Behavioral Response:CasualAlertDepressed  Type of Therapy: Group Therapy, psychoeducation, psychotherapy  Treatment Goals addressed: Coping  Interventions:CBT, DBT, Solution Focused, Supportive and Reframing  Summary:Cln led discussion on feeling "trapped" and how that presents, affects mood, and how we can manage it. Group members connected over themes of feeling that their situations are out of their control and feeling hopeless.   Therapist Response: Pt shares he feels "trapped" in his life and this makes him hopeless often. Pt expresses feelings of powerlessness or lack of agency however identifies it as listlessness and a-motivation. Pt connects with idea of life happening "to" him and struggles to see other perspectives.      Session Time: 11:00 -12:00  Participation Level: Active  Behavioral Response: CasualAlertDepressed  Type of Therapy: Group Therapy, psychotherapy  Treatment Goals addressed: Coping  Interventions: Strengths based, reframing, Supportive,   Summary:  Spiritual Care group  Therapist Response: Patient engaged in group. See chaplain note.        Session Time: 12:00- 1:00  Participation Level: Active  Behavioral Response: CasualAlertDepressed  Type of Therapy: Group Therapy, Psychoeducation  Treatment Goals addressed: Coping  Interventions: relaxation training; Supportive; Reframing  Summary: 12:00 - 12:50: Relaxation group: Cln led group focused on retraining the body's response to stress.   12:50 -1:00 Clinician led check-out. Clinician assessed for  immediate needs, medication  compliance and efficacy, and safety concerns   Therapist Response: Patient engaged in activity and discussion.  At [place], patient rates his mood at a 4.5 on a scale of 1-10 with 10 being great. Pt states afternoon plans of "finding something to do" and states he may watch tv. Patient demonstrates some progress as evidenced by participating in first group session. Patient denies SI/HI/self-harm at the end of group.    Suicidal/Homicidal: Nowithout intent/plan  Plan: Pt will continue in PHP while working to decrease depression and anxiety symptoms, increase ability to manage symptoms in a healthy manner when they arise, and decrease passive SI.   Diagnosis: Severe episode of recurrent major depressive disorder, without psychotic features ([place]) [F33.2]    1. Severe episode of recurrent major depressive disorder, without psychotic features ([place])   2. GAD (generalized anxiety disorder)       Kirk Glass, LCSW 11/16/2018

## 2018-11-16 NOTE — Psych (Signed)
Virtual Visit via Video Note  I connected with Kirk Leon on 11/16/18 at  9:00 AM EDT by a video enabled telemedicine application and verified that I am speaking with the correct person using two identifiers.   I discussed the limitations of evaluation and management by telemedicine and the availability of in person appointments. The patient expressed understanding and agreed to proceed.   I discussed the assessment and treatment plan with the patient. The patient was provided an opportunity to ask questions and all were answered. The patient agreed with the plan and demonstrated an understanding of the instructions.   The patient was advised to call back or seek an in-person evaluation if the symptoms worsen or if the condition fails to improve as anticipated.  I provided 10 minutes of non-face-to-face time during this encounter.   Pt's treatment plan was discussed and pt verbally agreed to plan.    [name], LCSW

## 2018-11-17 ENCOUNTER — Telehealth (HOSPITAL_COMMUNITY): Payer: Self-pay | Admitting: Professional

## 2018-11-17 ENCOUNTER — Encounter (HOSPITAL_COMMUNITY): Payer: Self-pay | Admitting: Family

## 2018-11-17 ENCOUNTER — Other Ambulatory Visit: Payer: Self-pay

## 2018-11-17 ENCOUNTER — Other Ambulatory Visit (HOSPITAL_COMMUNITY): Payer: BLUE CROSS/BLUE SHIELD | Admitting: Occupational Therapy

## 2018-11-17 ENCOUNTER — Other Ambulatory Visit: Payer: Self-pay | Admitting: Family

## 2018-11-17 ENCOUNTER — Other Ambulatory Visit (HOSPITAL_COMMUNITY): Payer: BLUE CROSS/BLUE SHIELD | Admitting: Licensed Clinical Social Worker

## 2018-11-17 DIAGNOSIS — F411 Generalized anxiety disorder: Secondary | ICD-10-CM

## 2018-11-17 DIAGNOSIS — R4589 Other symptoms and signs involving emotional state: Secondary | ICD-10-CM

## 2018-11-17 DIAGNOSIS — F332 Major depressive disorder, recurrent severe without psychotic features: Secondary | ICD-10-CM

## 2018-11-17 MED ORDER — BUPROPION HCL ER (XL) 300 MG PO TB24: 300.0000 mg | ORAL_TABLET | ORAL | 2 refills | Status: AC

## 2018-11-17 MED ORDER — ZOLPIDEM TARTRATE ER 12.5 MG PO TBCR: 12.5000 mg | EXTENDED_RELEASE_TABLET | Freq: Every evening | ORAL | 1 refills | Status: AC | PRN

## 2018-11-17 NOTE — Psych (Signed)
Virtual Visit via Video Note  I connected with Kirk Leon on 11/17/18 at  9:00 AM EDT by a video enabled telemedicine application and verified that I am speaking with the correct person using two identifiers.   I discussed the limitations of evaluation and management by telemedicine and the availability of in person appointments. The patient expressed understanding and agreed to proceed.  I discussed the assessment and treatment plan with the patient. The patient was provided an opportunity to ask questions and all were answered. The patient agreed with the plan and demonstrated an understanding of the instructions.   The patient was advised to call back or seek an in-person evaluation if the symptoms worsen or if the condition fails to improve as anticipated.  Pt was provided 240 minutes of non-face-to-face time during this encounter.   [name], LCSW    [facility] PHP THERAPIST PROGRESS NOTE  RAMERE DOWNS [id number]  Session Time: 9:00 - 10:00  Participation Level: Active  Behavioral Response: CasualAlertDepressed  Type of Therapy: Group Therapy  Treatment Goals addressed: Coping  Interventions: CBT, DBT, Supportive and Reframing  Summary:  Clinician led check-in regarding current stressors and situation, and review of patient completed daily inventory. Clinician utilized active listening and empathetic response and validated patient emotions. Clinician facilitated processing group on pertinent issues.   Therapist Response: AARAN ENBERG is a 28 y.o. male who presents with depression and anxiety symptoms. Patient arrived within time allowed and reports that he is feeling "overwhelmed and exhausted." Patient rates his mood at a 4 on a scale of 1-10 with 10 being great. Pt reports he "didn't do anything" yesterday. Pt shares he went outside for a little bit, watched a show, and watched a basketbal game. Pt is frustrated with his COVID quarantine and the limitations it has  placed on him. Pt reports continued ruminations and racing thoughts and denies making efforts to manage them. Pt struggles to see the positive and when cln or group member points one out he is quick to dismiss it. Patient engaged in discussion.      Session Time: 10:00 -11:00  Participation Level: Active  Behavioral Response: CasualAlertDepressed  Type of Therapy: Group Therapy, psychoeducation, psychotherapy  Treatment Goals addressed: Coping  Interventions: CBT, DBT, Solution Focused, Supportive and Reframing  Summary: Cln introduced distress tolerance skills, reviewing their purpose and how to practice them with accurate expectations. Cln introduced STOP skill and how to practice it. Group members discussed what common issues they have in which STOP skill would be beneficial.   Therapist Response: Pt reports understanding of skill and engaged in discussion. Pt identifies he can utilize STOP skill when ruminating.        Session Time: 11:00- 12:00  Participation Level: Active  Behavioral Response: CasualAlertDepressed  Type of Therapy: Group Therapy, Psychoeducation; Psychotherapy  Treatment Goals addressed: Coping  Interventions: CBT; Solution focused; Supportive; Reframing  Summary: Clinician continued topic of distress tolerance skills and introduced TIP skills. Group members discussed ways they could utilize the skills in their everyday life.    Therapist Response: Pt reports understanding of TIP skills and how to implement them. Pt reports he is most likely to practice paced breathing.      Session Time: 12:00 -1:00  Participation Level:Active  Behavioral Response:CasualAlertDepressed  Type of Therapy: Group Therapy, OT  Treatment Goals addressed: Coping  Interventions:Psychosocial skills training, Supportive,   Summary:12:00 - 12:50:Occupational Therapy group 12:50 -1:00 Clinician led check-out. Clinician assessed  for immediate needs,  medication compliance and efficacy, and safety concerns   Therapist Response: 12:00 - 12:50: Patient engaged in group. See OT note.  12:50 - 1:00: At check-out, patient rates his mood at a 5 on a scale of 1-10 with 10 being great. Pt states afternoon plans of going outside, playing guitar, watching a tv show, and watching sports tonight. Patient demonstrates some progress as evidenced by brightened mood by end of group. Patient denies SI/HI/self-harm at the end of group.    Suicidal/Homicidal: Nowithout intent/plan  Plan: Pt will continue in PHP while working to decrease depression and anxiety symptoms, increase ability to manage symptoms in a healthy manner when they arise, and decrease passive SI.   Diagnosis: Severe episode of recurrent major depressive disorder, without psychotic features ([facility]) [F33.2]    1. Severe episode of recurrent major depressive disorder, without psychotic features ([facility])   2. GAD (generalized anxiety disorder)       Donia GuilesJenny Sumit Branham, LCSW 11/17/2018

## 2018-11-17 NOTE — Progress Notes (Signed)
Medication to include Ambien 5 mg PRN for insomnia and Wellbutrin 300 mg was called in on 09/10 @ 15:55 verified and confirmed at [facility] on [address], by this NP

## 2018-11-17 NOTE — Progress Notes (Signed)
Wellbutrin 150 mg increased to 300 mg medication. MD [name] to f/u with Ambien. NP unable to send in via  imprivata. Paper prdx is in office with CMA. T.Lewis

## 2018-11-18 ENCOUNTER — Encounter (HOSPITAL_COMMUNITY): Payer: Self-pay | Admitting: Occupational Therapy

## 2018-11-18 ENCOUNTER — Other Ambulatory Visit (HOSPITAL_COMMUNITY): Payer: BLUE CROSS/BLUE SHIELD | Admitting: Occupational Therapy

## 2018-11-18 ENCOUNTER — Encounter (HOSPITAL_COMMUNITY): Payer: Self-pay

## 2018-11-18 ENCOUNTER — Other Ambulatory Visit: Payer: Self-pay

## 2018-11-18 ENCOUNTER — Other Ambulatory Visit (HOSPITAL_COMMUNITY): Payer: BLUE CROSS/BLUE SHIELD | Admitting: Licensed Clinical Social Worker

## 2018-11-18 DIAGNOSIS — R4589 Other symptoms and signs involving emotional state: Secondary | ICD-10-CM

## 2018-11-18 DIAGNOSIS — F332 Major depressive disorder, recurrent severe without psychotic features: Secondary | ICD-10-CM

## 2018-11-18 DIAGNOSIS — F411 Generalized anxiety disorder: Secondary | ICD-10-CM

## 2018-11-18 NOTE — Psych (Signed)
Virtual Visit via Video Note  I connected with Kirk AbuAndrew J Leon on 11/18/18 at  9:00 AM EDT by a video enabled telemedicine application and verified that I am speaking with the correct person using two identifiers.   I discussed the limitations of evaluation and management by telemedicine and the availability of in person appointments. The patient expressed understanding and agreed to proceed.  I discussed the assessment and treatment plan with the patient. The patient was provided an opportunity to ask questions and all were answered. The patient agreed with the plan and demonstrated an understanding of the instructions.   The patient was advised to call back or seek an in-person evaluation if the symptoms worsen or if the condition fails to improve as anticipated.  Pt was provided 240 minutes of non-face-to-face time during this encounter.   Donia GuilesJenny Cristle Jared, LCSW    [facility]  Kirk Leon [id number]  Session Time: 9:00 - 10:00  Participation Level: Active  Behavioral Response: CasualAlertDepressed  Type of Therapy: Group Therapy  Treatment Goals addressed: Coping  Interventions: CBT, DBT, Supportive and Reframing  Summary:  Clinician led check-in regarding current stressors and situation, and review of patient completed daily inventory. Clinician utilized active listening and empathetic response and validated patient emotions. Clinician facilitated processing group on pertinent issues.   Therapist Response: Kirk Leon is a 28 y.o. male who presents with depression and anxiety symptoms. Patient arrived within time allowed and reports that he is feeling "tired." Patient rates his mood at a 4.5 on a scale of 1-10 with 10 being great. Pt reports he felt "a little better yesterday" and his afternoon and evening went fairly well. Pt reports he achieved his immediate goals he set in OT and played guitar and watched a show. Pt states he spent time outside and  watched some of a football game in the evening and felt disheartened that the game did not interest him "as much as it normally would." Pt reports struggling to move on from his past relationship. Pt able to process. Patient engaged in discussion.      Session Time: 10:00 -11:00  Participation Level:Active  Behavioral Response:CasualAlertDepressed  Type of Therapy: Group Therapy, OT  Treatment Goals addressed: Coping  Interventions:Psychosocial skills training, Supportive  Summary:Occupational Therapy group  Therapist Response: Patient engaged in group. See OT note.       Session Time: 11:00- 12:00  Participation Level: Active  Behavioral Response: CasualAlertDepressed  Type of Therapy: Group Therapy, Psychoeducation; Psychotherapy  Treatment Goals addressed: Coping  Interventions: CBT; Solution focused; Supportive; Reframing  Summary: Clinician continued topic of distress tolerance skills and led review of yesterday and discussed practice done yesterday and how to problem solve issues that arose.   Therapist Response: Pt participated in review and exhibits some recall. Pt states he did not practice yesterday and states it was because "I didn't think about it."        Session Time: 12:00 -1:00  Participation Level: Active  Behavioral Response: CasualAlertDepressed  Type of Therapy: Group Therapy, psychoeducation, psychotherapy  Treatment Goals addressed: Coping  Interventions: CBT, DBT, Solution Focused, Supportive and Reframing  Summary: 12:00 - 12:50 Cln continued distress tolerance skills and introduced ACCEPTS skill. Group discussed A-C-C-E and brainstormed how they can apply the skills in their life.  12:50 -1:00 Clinician led check-out. Clinician assessed for immediate needs, medication compliance and efficacy, and safety concerns  Therapist Response: Pt reports understanding of skills discussed and shares he can  practice by playing video games, watching tv/movies, and being outside. Pt struggles to think of activities he can do while in quarantine and is dismissive of suggestions by group members.  12:50 - 1:00: At check-out, patient rates his mood at a 4 on a scale of 1-10 with 10 being great. Pt states no specific plans for his weekend because "I'm quarantined. I can't do anything." Pt states he will likely spend time outside and watch more of a tv show.  Patient demonstrates some progress as evidenced by completing yesterday's goals. Patient denies SI/HI/self-harm at the end of group.    Suicidal/Homicidal: Nowithout intent/plan  Plan: Pt will continue in PHP while working to decrease depression and anxiety symptoms, increase ability to manage symptoms in a healthy manner when they arise, and decrease passive SI.   Diagnosis: Severe episode of recurrent major depressive disorder, without psychotic features ([place]) [F33.2]    1. Severe episode of recurrent major depressive disorder, without psychotic features ([place])   2. GAD (generalized anxiety disorder)       [name], LCSW 11/18/2018

## 2018-11-18 NOTE — Therapy (Signed)
[employer] [place], [place], [address] Phone: [phone]   Fax:  [phone]  Occupational Therapy Treatment  Patient Details  Name: Kirk Leon MRN: [id number] Date of Birth: [birth year] Referring Provider (OT): [name], NP  Virtual Visit via Video Note  I connected with Kirk Leon on 11/18/18 at  8:00 AM EDT by a video enabled telemedicine application and verified that I am speaking with the correct person using two identifiers.   I discussed the limitations of evaluation and management by telemedicine and the availability of in person appointments. The patient expressed understanding and agreed to proceed.  I discussed the assessment and treatment plan with the patient. The patient was provided an opportunity to ask questions and all were answered. The patient agreed with the plan and demonstrated an understanding of the instructions.   The patient was advised to call back or seek an in-person evaluation if the symptoms worsen or if the condition fails to improve as anticipated.  I provided 60 minutes of non-face-to-face time during this encounter.   [name], OT    Encounter Date: 11/18/2018  OT End of Session - 11/18/18 1145    Visit Number  2    Number of Visits  12    Date for OT Re-Evaluation  12/16/18    Authorization Type  [employer]    OT Start Time  1100    OT Stop Time  1200    OT Time Calculation (min)  60 min    Activity Tolerance  Patient tolerated treatment well    Behavior During Therapy  WFL for tasks assessed/performed       Past Medical History:  Diagnosis Date  . Asthma   . Seasonal allergies     Past Surgical History:  Procedure Laterality Date  . APPENDECTOMY  2012    There were no vitals filed for this visit.  Subjective Assessment - 11/18/18 1144    Currently in Pain?  No/denies         S: I am familiar with deep breathing and yoga  O: Pt  educated on stress management and its implications with BADL/IADL. Stress management tool worksheet discussed to educate on unhealthy vs healthy coping skills to manage stress to improve community integration. Coping strategies taught include: relaxation based- deep breathing, counting to 10, taking a 1 minute vacation, acceptance, stress balls, relaxation audio/video, visual/mental imagery. Positive mental attitude- gratitude, acceptance, cognitive reframing, positive self talk, anger management.  A: Pt presents with blunted affect, engaged and participatory. He shares how he suppresses his stress and takes it out on himself. Pt shares that he likes relaxation strategies. He would like to continue to implement these with more detail as well as start journaling.   P: OT group will be x3 per week while pt in PHP               OT Education - 11/18/18 1145    Education Details  education given on stress management skills    Person(s) Educated  Patient    Methods  Explanation;Handout    Comprehension  Verbalized understanding       OT Short Term Goals - 11/18/18 1147      OT SHORT TERM GOAL #1   Title  Pt will be educated on strategies to improve psychosocial skills needed to participate fully in all daily, work, and leisure activities    Time  4    Period  Weeks    Status  On-going    Target Date  12/16/18      OT SHORT TERM GOAL #2   Title  Pt will apply psychosocial skills and coping mechanisms to daily activities in order to function independently and reintegrate into community    Time  4    Period  Weeks    Status  On-going      OT SHORT TERM GOAL #3   Title  Pt will recall and/or apply 1-3 sleep hygiene strategies to improve function in BADL routine upon reintegrating into community    Time  4    Period  Weeks    Status  On-going      OT SHORT TERM GOAL #4   Title  Pt will recall 3-5 RTW and school skills needed for successful reintegration into community    Time   4    Period  Weeks    Status  On-going      OT SHORT TERM GOAL #5   Title  Pt will engage in goal setting to improve functional BADL/IADL routine upon reintegrating into community.    Time  4    Period  Weeks    Status  On-going               Plan - 11/18/18 1147    Occupational performance deficits (Please refer to evaluation for details):  ADL's;IADL's;Rest and Sleep;Education;Work;Leisure;Social Participation    Body Structure / Function / Physical Skills  ADL;IADL    Cognitive Skills  Energy/Drive;Emotional    Psychosocial Skills  Coping Strategies;Interpersonal Interaction;Routines and Behaviors       Patient will benefit from skilled therapeutic intervention in order to improve the following deficits and impairments:   Body Structure / Function / Physical Skills: ADL, IADL Cognitive Skills: Energy/Drive, Emotional Psychosocial Skills: Coping Strategies, Interpersonal Interaction, Routines and Behaviors   Visit Diagnosis: Severe episode of recurrent major depressive disorder, without psychotic features (HCC)  GAD (generalized anxiety disorder)  Difficulty coping    Problem List Patient Active Problem List   Diagnosis Date Noted  . Suicidal ideation 11/10/2018   Kirk Leon, MSOT, OTR/L Behavioral Health OT/ Acute Relief OT PHP Office: [phone]  Kirk HandingKaylee Sharlot Sturkey 11/18/2018, 11:49 AM  [facility] [address] [place], [place], [address] Phone: [phone]   Fax:  [phone]  Name: Kirk Leon MRN: [id number] Date of Birth: 09/06/1990

## 2018-11-18 NOTE — Progress Notes (Signed)
Spoke with patient via Webex video call, used 2 identifiers to correctly identify patient. Pleasant, cooperative states he was referred by Dr. Casimiro Needle for depression and anxiety. This is his first time in a group setting and he has only seen a psychiatrist once. Has dealt with depression for approximately 10 years. Has vague SI at times. Feels he would be better off dead and wishes he wouldn't wake up but has no intent for suicide. His stressors include family health issues, going through a break up, and started law school but withdrew. He lives in [place] with his best friend but returned to [place] to stay with his parents to get help for depression. Has been back in [place] for 2 weeks now. Wellbutrin was increased to 300mg  and will start Ambien tonight. Was taking Trazodone but it caused vivid dreams and didn't help. On scale of 1-10 as 10 being worst he rates anixety at 8 and depression at 8. PHQ9=22. Denies SI/HI or AV hallucinations. He says groups are going "OK." He feels some guilt for having depression when he hears the struggles of others and their reasons for depression seem to be more justified. He doesn't think he should be depressed but does understand it's a chemical imbalance. No issues or complaints.

## 2018-11-18 NOTE — Therapy (Signed)
[place], [place], [address] Phone: [phone]   Fax:  [phone]  Occupational Therapy Evaluation  Patient Details  Name: Kirk Leon MRN: [id number] Date of Birth: [birth year] Referring Provider (OT): [name], NP  Virtual Visit via Video Note  I connected with Kirk Leon on 11/18/18 at  8:00 AM EDT by a video enabled telemedicine application and verified that I am speaking with the correct person using two identifiers.   I discussed the limitations of evaluation and management by telemedicine and the availability of in person appointments. The patient expressed understanding and agreed to proceed.  I discussed the assessment and treatment plan with the patient. The patient was provided an opportunity to ask questions and all were answered. The patient agreed with the plan and demonstrated an understanding of the instructions.   The patient was advised to call back or seek an in-person evaluation if the symptoms worsen or if the condition fails to improve as anticipated.  I provided 90 minutes of non-face-to-face time during this encounter.   [name], OT    Encounter Date: 11/17/2018  OT End of Session - 11/18/18 0931    Visit Number  1    Number of Visits  12    Date for OT Re-Evaluation  12/16/18    Authorization Type  [employer]    OT Start Time  1100    OT Stop Time  1230    OT Time Calculation (min)  90 min    Activity Tolerance  Patient tolerated treatment well    Behavior During Therapy  WFL for tasks assessed/performed       Past Medical History:  Diagnosis Date  . Asthma   . Seasonal allergies     History reviewed. No pertinent surgical history.  There were no vitals filed for this visit.  Subjective Assessment - 11/18/18 0928    Currently in Pain?  No/denies        [facility] OT Assessment - 11/18/18 0001      Assessment   Medical Diagnosis  Severe episode  of recurrent major depressive disorder; Generalized anxiety disorder    Referring Provider (OT)  [name], NP    Onset Date/Surgical Date  11/18/18      Precautions   Precautions  None      Restrictions   Weight Bearing Restrictions  No      Balance Screen   Has the patient fallen in the past 6 months  No    Has the patient had a decrease in activity level because of a fear of falling?   No    Is the patient reluctant to leave their home because of a fear of falling?   No        OT assessment: OCAIRS  Diagnosis: MDD severe, GAD  Past medical history/referral information: Pt presents to PHP After recent psychiatric evaluation in the ED, pt then sent to [facility] for higher level of OP care  **Pt does report having COVID-19 currently, but is quarantined and program is fully virtual  Living situation: Pt lives alone, normally in [place] but is here [place] as of late  ADLs/IADLs: reports decreased engagement and difficulty/low motivation  Work/school: Pt was going to [employer] school and working, currently not engaging in either due to [place] reasons  Leisure: cannot identify at this time other than guitar, but is not actively doing  Social support: recent break up with girlfriend, reports  some family and friends but is currently quarantined  [facility] Mental Health Interview Summary of Client Scores:  FACILITATES PARTICIPATION IN OCCUPATION  ALLOWS PARTICIPATION IN OCCUPATION INHIBITS PARTICIPATION IN OCCUPATION RESTRICTS PARTICIPATION IN OCCUPATION COMMENTS  ROLES               X   HABITS               X   PERSONAL CAUSATION                          X   VALUES              X    INTERESTS              X    SKILLS              X    SHORT TERM GOALS              X    LONG TERM GOALS              X    INTERPETATION OF PAST EXPERIENCES              X    PHYSICAL ENVIRONMENT               X Quarantined due to COVID 19  SOCIAL ENVIRONMENT               X   READINESS FOR CHANGE              X       Need for Occupational Therapy:  4 Shows positive occupational participation, no need for OT.   3 Need for minimal intervention/consultative participation     X 2 Need for OT intervention indicated to restore/improve participation   1 Need for extensive OT intervention indicated to improve participation.  Referral for follow up services also recommended.    Assessment:  Patient demonstrates behavior that inhibits participation in occupation.  Patient will benefit from occupational therapy intervention in order to improve time management, financial management, stress management, job readiness skills, social skills, and health management skills in preparation to return to full time community living and to be a productive community member.    Plan:  Patient will participate in skilled occupational therapy sessions individually or in a group setting to improve coping skills, psychosocial skills, and emotional skills required to return to prior level of function.  Treatment will be 3 times per week for 4 weeks.      OT TREATMENT  S: This is helpful for my motivation   O: Education given on self-accountability being in line with personal values and goals to maintain occupational balance in various community settings. Pt given goal identifying worksheet to list immediate, short term, medium term, and long-term goals using a SMART goal framework (specificity, meaningful, adaptive, realistic, and time bound). Goals created as guideline for pt to practice being accountable in various situations. Pt completed work sheet of goals and encouraged to share goals with the group, with emphasis on immediate goal for check in with pt for next session to maintain accountability.   A: Pt presents with flat affect, engaged and participatory. He shares how these goals will be benefical to improve the area of personal growth/health. Pt created immediate, short, medium, and long term goals appropriately. He states  that he will need to continue to work on these for  improved motivation.  P: OT Group will be x3 per week while pt in PHP        OT Education - 11/18/18 0930    Education Details  education given on SMART goals    Person(s) Educated  Patient    Methods  Explanation;Handout    Comprehension  Verbalized understanding       OT Short Term Goals - 11/18/18 0933      OT SHORT TERM GOAL #1   Title  Pt will be educated on strategies to improve psychosocial skills needed to participate fully in all daily, work, and leisure activities    Time  4    Period  Weeks    Status  New    Target Date  12/16/18      OT SHORT TERM GOAL #2   Title  Pt will apply psychosocial skills and coping mechanisms to daily activities in order to function independently and reintegrate into community    Time  4    Period  Weeks    Status  New    Target Date  12/16/18      OT SHORT TERM GOAL #3   Title  Pt will recall and/or apply 1-3 sleep hygiene strategies to improve function in BADL routine upon reintegrating into community    Time  4    Period  Weeks    Status  New    Target Date  12/16/18      OT SHORT TERM GOAL #4   Title  Pt will recall 3-5 RTW and school skills needed for successful reintegration into community    Time  4    Period  Weeks    Status  New    Target Date  12/16/18      OT SHORT TERM GOAL #5   Title  Pt will engage in goal setting to improve functional BADL/IADL routine upon reintegrating into community.    Time  4    Period  Weeks    Status  New    Target Date  12/16/18               Plan - 11/18/18 0931    OT Occupational Profile and History  Detailed Assessment- Review of Records and additional review of physical, cognitive, psychosocial history related to current functional performance    Occupational performance deficits (Please refer to evaluation for details):  ADL's;IADL's;Rest and Sleep;Education;Work;Leisure;Social Participation    Body Structure /  Function / Physical Skills  ADL;IADL    Cognitive Skills  Energy/Drive;Emotional    Psychosocial Skills  Coping Strategies;Interpersonal Interaction;Routines and Behaviors    Rehab Potential  Good    Clinical Decision Making  Limited treatment options, no task modification necessary    Comorbidities Affecting Occupational Performance:  None    Modification or Assistance to Complete Evaluation   No modification of tasks or assist necessary to complete eval    OT Frequency  3x / week    OT Duration  4 weeks    OT Treatment/Interventions  Coping strategies training;Psychosocial skills training;Self-care/ADL training;Other (comment)   community reintegration      Patient will benefit from skilled therapeutic intervention in order to improve the following deficits and impairments:   Body Structure / Function / Physical Skills: ADL, IADL Cognitive Skills: Energy/Drive, Emotional Psychosocial Skills: Coping Strategies, Interpersonal Interaction, Routines and Behaviors   Visit Diagnosis: Severe episode of recurrent major depressive disorder, without psychotic features (HCC)  GAD (generalized anxiety disorder)  Difficulty  coping    Problem List Patient Active Problem List   Diagnosis Date Noted  . Suicidal ideation 11/10/2018   [name] Panayiotis [place], MSOT, OTR/L Behavioral Health OT/ Acute Relief OT PHP Office: [phone]  [name] Kazuma Elena 11/18/2018, 9:37 AM  [facility] [address] [place], [place], [address] Phone: [phone]   Fax:  [phone]  Name: Barnett Abundrew J Archambeault MRN: [id number] Date of Birth: 09/20/1990

## 2018-11-21 ENCOUNTER — Encounter (HOSPITAL_COMMUNITY): Payer: Self-pay | Admitting: Licensed Clinical Social Worker

## 2018-11-21 ENCOUNTER — Other Ambulatory Visit (HOSPITAL_COMMUNITY): Payer: BLUE CROSS/BLUE SHIELD | Admitting: Family

## 2018-11-21 ENCOUNTER — Other Ambulatory Visit: Payer: Self-pay | Admitting: Family

## 2018-11-21 ENCOUNTER — Other Ambulatory Visit: Payer: Self-pay

## 2018-11-21 DIAGNOSIS — F411 Generalized anxiety disorder: Secondary | ICD-10-CM

## 2018-11-21 DIAGNOSIS — F332 Major depressive disorder, recurrent severe without psychotic features: Secondary | ICD-10-CM

## 2018-11-21 MED ORDER — HYDROXYZINE HCL 25 MG PO TABS: 25.0000 mg | ORAL_TABLET | Freq: Three times a day (TID) | ORAL | 0 refills | Status: AC | PRN

## 2018-11-21 NOTE — Progress Notes (Signed)
Spiritual care group 11/16/2018 11:00 - 12:00 ?Facilitated by Simone Curia, MDiv, BCC.  Group met via web-ex chat in compliance with COVID - 19 precautions  ?  Group focused on topic of strength. ? Group members reflected on what thoughts and feelings emerge when they hear this topic. They then engaged in facilitated dialog around how strength is present in their lives. This dialog focused on representing what strength had been to them in their lives (images and patterns given) and what they saw as helpful in their life now (what they needed / wanted). ?   Activity drew on narrative framework    Kirk Leon was present throughout group.  Identified with broadening the definition of strength to include emotional strength - going on to define this as "awareness."  He described moments where he had been aware that he had not been happy for a long time and had mistaken "numbness" for "happiness."  States he is hopeful that noticing this will allow him to address it.  He described a desire to be in a vocation that is connected to what he values - relating that reading the book "[employer]" influenced him to try [employer].  Engaged with other group member around being connected to values and living out values as strength.    Jerene Pitch, MDiv, [facility]

## 2018-11-21 NOTE — Progress Notes (Signed)
Virtual Visit via Video Note  I connected with Kirk Leon on 11/21/18 at  9:00 AM EDT by a video enabled telemedicine application and verified that I am speaking with the correct person using two identifiers.   I discussed the limitations of evaluation and management by telemedicine and the availability of in person appointments. The patient expressed understanding and agreed to proceed.    I discussed the assessment and treatment plan with the patient. The patient was provided an opportunity to ask questions and all were answered. The patient agreed with the plan and demonstrated an understanding of the instructions.   The patient was advised to call back or seek an in-person evaluation if the symptoms worsen or if the condition fails to improve as anticipated.  I provided 00  minutes of non-face-to-face time during this encounter.   Kirk Rackanika N Lewis, NP   BH MD/PA/NP OP Progress Note  11/21/2018 10:47 AM Kirk Leon  MRN:  [id number]   Evaluation: Kirk Leon seen via WebEx.  He is awake, alert and oriented x3.  Denying suicidal or homicidal ideations.  Denies auditory or visual hallucinations.  Reports intermittent headaches and stomach upset with recent medication adjustment.  Patient was recently titrated up from 150 mg of Wellbutrin to 300 mg daily.  Education was provided with taking medications with food and increasing water intake.  Kirk Leon was initiated on Ambien 5 mg p.o. nightly however reports " seeing shadows."  Stated that he is not interested in taking this medication at this time. Np to discontinue Ambien patient to continue trazodone.  Patient was receptive to plan.  We will follow-up with patient on 9/16.  And will consider medication adjustment and/or changing.  Patient stated that his headache has resolved.  We will continue to monitor symptoms.  Kirk Leon reports his appetite is fluctuating.  Reports he is resting well throughout the night.  Continues to report depression and  depressive symptoms which is made worse by positive COVID results and social isolation from family.  We will continue to monitor for safety.  Support, encouragement and  reassurance was provided.  Visit Diagnosis: No diagnosis found.  Past Psychiatric History:   Past Medical History:  Past Medical History:  Diagnosis Date  . Asthma   . Seasonal allergies     Past Surgical History:  Procedure Laterality Date  . APPENDECTOMY  2012    Family Psychiatric History:   Family History:  Family History  Problem Relation Age of Onset  . Depression Maternal Aunt     Social History:  Social History   Socioeconomic History  . Marital status: Single    Spouse name: Not on file  . Number of children: Not on file  . Years of education: Not on file  . Highest education level: Not on file  Occupational History  . Not on file  Social Needs  . Financial resource strain: Not hard at all  . Food insecurity    Worry: Never true    Inability: Never true  . Transportation needs    Medical: No    Non-medical: No  Tobacco Use  . Smoking status: Never Smoker  . Smokeless tobacco: Never Used  Substance and Sexual Activity  . Alcohol use: Yes    Comment: 4-5 mixed drinks 1-2 weekly  . Drug use: Yes    Types: Marijuana    Comment: uses on occasion  . Sexual activity: Not on file  Lifestyle  . Physical activity    Days per  week: 7 days    Minutes per session: 30 min  . Stress: Very much  Relationships  . Social connections    Talks on phone: More than three times a week    Gets together: More than three times a week    Attends religious service: More than 4 times per year    Active member of club or organization: Yes    Attends meetings of clubs or organizations: More than 4 times per year    Relationship status: Never married  Other Topics Concern  . Not on file  Social History Narrative  . Not on file    Allergies:  Allergies  Allergen Reactions  . Sulfa Antibiotics Hives     Metabolic Disorder Labs: No results found for: HGBA1C, MPG No results found for: PROLACTIN No results found for: CHOL, TRIG, HDL, CHOLHDL, VLDL, LDLCALC No results found for: TSH  Therapeutic Level Labs: No results found for: LITHIUM No results found for: VALPROATE No components found for:  CBMZ  Current Medications: Current Outpatient Medications  Medication Sig Dispense Refill  . buPROPion (WELLBUTRIN XL) 300 MG 24 hr tablet Take 1 tablet (300 mg total) by mouth every morning. 30 tablet 2  . hydrOXYzine (ATARAX/VISTARIL) 25 MG tablet Take 1 tablet (25 mg total) by mouth 3 (three) times daily as needed for anxiety. 42 tablet 0  . zolpidem (AMBIEN CR) 12.5 MG CR tablet Take 1 tablet (12.5 mg total) by mouth at bedtime as needed for sleep. 30 tablet 1   No current facility-administered medications for this visit.      Musculoskeletal: Strength & Muscle Tone: within normal limits Gait & Station: normal Patient leans: N/A  Psychiatric Specialty Exam: Review of Systems  Psychiatric/Behavioral: Positive for depression. Negative for suicidal ideas. The patient is nervous/anxious.   All other systems reviewed and are negative.   There were no vitals taken for this visit.There is no height or weight on file to calculate BMI.  General Appearance: Casual  Eye Contact:  Fair  Speech:  Clear and Coherent  Volume:  Normal  Mood:  Anxious and Depressed  Affect:  Appropriate  Thought Process:  Coherent  Orientation:  Full (Time, Place, and Person)  Thought Content: Logical   Suicidal Thoughts:  No  Homicidal Thoughts:  No  Memory:  Immediate;   Fair Recent;   Fair  Judgement:  Fair  Insight:  Fair  Psychomotor Activity:  Normal  Concentration:  Concentration: Fair  Recall:  [employer] of Knowledge: Fair  Language: Fair  Akathisia:  No  Handed:  Right  AIMS (if indicated):   Assets:  Communication Skills Desire for Improvement Resilience Social Support  ADL's:   Intact  Cognition: WNL  Sleep:  Fair   Screenings: AIMS     Admission (Discharged) from OP Visit from 11/10/2018 in [place] [facility] [facility]  AIMS Total Score  0    AUDIT     Admission (Discharged) from OP Visit from 11/10/2018 in [place] [facility]  Alcohol Use Disorder Identification Test Final Score (AUDIT)  2    PHQ2-9     Counselor from 11/18/2018 in [place]  PHQ-2 Total Score  6  PHQ-9 Total Score  22       Assessment and Plan:  Continue with partial hospitalization programming - will continue to monitor symptoms with Wellbutrin 300 mg - Vistaril was refilled -Discontinued Ambien 5 mg p.o. nightly- due to reported  side side effects  Treatment plan was reviewed and agreed upon by NP Chilton Greathouse and patient Kirk Leon need for continued group services   Kirk Rack, NP 11/21/2018, 10:47 AM

## 2018-11-21 NOTE — Addendum Note (Signed)
Addended by: [name] on: 11/21/2018 04:59 PM   Modules accepted: Orders

## 2018-11-21 NOTE — Psych (Signed)
Virtual Visit via Video Note  I connected with Kirk Leon on 11/21/18 at  9:00 AM EDT by a video enabled telemedicine application and verified that I am speaking with the correct person using two identifiers.   I discussed the limitations of evaluation and management by telemedicine and the availability of in person appointments. The patient expressed understanding and agreed to proceed.  I discussed the assessment and treatment plan with the patient. The patient was provided an opportunity to ask questions and all were answered. The patient agreed with the plan and demonstrated an understanding of the instructions.   The patient was advised to call back or seek an in-person evaluation if the symptoms worsen or if the condition fails to improve as anticipated.  Pt was provided 240 minutes of non-face-to-face time during this encounter.   [name], LCSW    [facility] [place] PHP THERAPIST PROGRESS NOTE  Kirk Leon [id number]  Session Time: 9:00 - 10:00  Participation Level: Active  Behavioral Response: CasualAlertDepressed  Type of Therapy: Group Therapy  Treatment Goals addressed: Coping  Interventions: CBT, DBT, Supportive and Reframing  Summary:  Clinician led check-in regarding current stressors and situation, and review of patient completed daily inventory. Clinician utilized active listening and empathetic response and validated patient emotions. Clinician facilitated processing group on pertinent issues.   Therapist Response: Kirk Leon is a 29 y.o. male who presents with depression and anxiety symptoms. Patient arrived within time allowed and reports that he is feeling "bored." Patient rates his mood at a 3 on a scale of 1-10 with 10 being great. Pt reports his weekend "wasn't great." Pt reports continued struggles with black and white thinking, specifically that he "couldn't do anything" because he was unable to leave the house. Pt reports dealing with stomach pains  and a headache off and on throughout the weekend. Patient engaged in discussion.      Session Time: 10:00 -11:00  Participation Level: Active  Behavioral Response: CasualAlertDepressed  Type of Therapy: Group Therapy, psychoeducation, psychotherapy  Treatment Goals addressed: Coping  Interventions: CBT, DBT, Solution Focused, Supportive and Reframing  Summary: Cln led discussion on struggling with using skills when escalated. Group members shared barriers they experience in utilizing skills. Cln encouraged pt's to gain awareness around triggers and consider ways to shift perspective.   Therapist Response: Patient participated in discussion and shares he feels his barriers are feeling hopeless and like there's "not point in trying" to feel better. Pt also struggles to utilize his support system because he does not want to tell them what is going on, he does not want to increase social activity because he is not willing to try activities he has not done before, and is hesitant to use skills because he is unsure if they will work. Pt exhibits poor insight into his areas of resistance.       Session Time: 11:00- 12:00  Participation Level: Active  Behavioral Response: CasualAlertDepressed  Type of Therapy: Group Therapy, Psychoeducation; Psychotherapy  Treatment Goals addressed: Coping  Interventions: CBT; DBT; Solution focused; Supportive; Reframing  Summary: Clinician continued topic of distress tolerance skills. Cln led review of previously discussed distress tolerance skills and group members shared ways they have practiced. Group finished ACCEPTS skills, "P-T-S" and how to utilize them.   Therapist Response: Pt demonstrates decent recall during review and shares he practiced distraction activities and pushing away with minimal success. Pt states he can utilize nails on the chalkboard as a distracting sensation.  Session Time: 12:00  -1:00  Participation Level: Active  Behavioral Response: CasualAlertDepressed  Type of Therapy: Group Therapy, Psychoeducation; Psychotherapy  Treatment Goals addressed: Coping  Interventions: CBT; DBT; Solution focused; Supportive; Reframing  Summary:12:00 - 12:50:Clinician continued topic of distress tolerance skills and reviewed Self-Soothe skill. Group members discussed ways they can implement the skill.   12:50 -1:00 Clinician led check-out. Clinician assessed for immediate needs, medication compliance and efficacy, and safety concerns   Therapist Response: 12:00 - 12:50: Pt reports understanding of self-soothe and identifies the ocean, nature sounds, and matzoh ball soup as ways he can utilize the skill.  12:50 - 1:00: At check-out, patient rates his mood at a 5 on a scale of 1-10 with 10 being great. Pt states afternoon plans of taking a walk, spending time outside, and watching a show. Patient demonstrates some progress as evidenced by trying skills over the weekend. Patient denies SI/HI/self-harm at the end of group.    Suicidal/Homicidal: Nowithout intent/plan  Plan: Pt will continue in PHP while working to decrease depression and anxiety symptoms, increase ability to manage symptoms in a healthy manner when they arise, and decrease passive SI.   Diagnosis: Severe episode of recurrent major depressive disorder, without psychotic features (HCC) [F33.2]    1. Severe episode of recurrent major depressive disorder, without psychotic features (HCC)   2. GAD (generalized anxiety disorder)       Kirk GuilesJenny Manya Balash, LCSW 11/21/2018

## 2018-11-22 ENCOUNTER — Other Ambulatory Visit (HOSPITAL_COMMUNITY): Payer: BLUE CROSS/BLUE SHIELD | Admitting: Occupational Therapy

## 2018-11-22 ENCOUNTER — Other Ambulatory Visit (HOSPITAL_COMMUNITY): Payer: BLUE CROSS/BLUE SHIELD | Admitting: Licensed Clinical Social Worker

## 2018-11-22 ENCOUNTER — Other Ambulatory Visit: Payer: Self-pay

## 2018-11-22 DIAGNOSIS — R4589 Other symptoms and signs involving emotional state: Secondary | ICD-10-CM

## 2018-11-22 DIAGNOSIS — F411 Generalized anxiety disorder: Secondary | ICD-10-CM

## 2018-11-22 DIAGNOSIS — F332 Major depressive disorder, recurrent severe without psychotic features: Secondary | ICD-10-CM

## 2018-11-22 NOTE — Progress Notes (Signed)
Spoke with patient via Webex video call, used 2 identifiers to correctly identify patient. States groups are going well for him, he takes notes to refer back to later. He still has trouble talking in a group setting but is getting more comfortable with it. Did not like the way Ambien CR made him feel, he was drowsy the next day and had trouble falling asleep so he switched back to his trazodone. He did sleep well last night. Has had some stomach cramps since starting Wellbutrin but they have gotten better. Denies SI/HI or AV hallucinations. Still has thoughts that he wishes he wouldn't wake up but no plan or intent for suicide. On scale of 1-10 as 10 being worst he rates depression at 6/7 and anxiety at same 6/7. No issues or complaints.

## 2018-11-22 NOTE — Psych (Signed)
Virtual Visit via Video Note  I connected with Kirk Leon on 11/22/18 at  9:00 AM EDT by a video enabled telemedicine application and verified that I am speaking with the correct person using two identifiers.   I discussed the limitations of evaluation and management by telemedicine and the availability of in person appointments. The patient expressed understanding and agreed to proceed.  I discussed the assessment and treatment plan with the patient. The patient was provided an opportunity to ask questions and all were answered. The patient agreed with the plan and demonstrated an understanding of the instructions.   The patient was advised to call back or seek an in-person evaluation if the symptoms worsen or if the condition fails to improve as anticipated.  Pt was provided 180 minutes of non-face-to-face time during this encounter.   Kirk GuilesJenny Tasmin Exantus, LCSW    [facility]  Kirk Leon [id number]  Session Time: 9:00 - 10:00  Participation Level: Did Not Attend  Behavioral Response: CasualAlertDepressed  Type of Therapy: Group Therapy  Treatment Goals addressed: Coping  Interventions: CBT, DBT, Supportive and Reframing  Summary:  Clinician led check-in regarding current stressors and situation, and review of patient completed daily inventory. Clinician utilized active listening and empathetic response and validated patient emotions. Clinician facilitated processing group on pertinent issues.   Therapist Response: Kirk Abundrew J Comp is a 28 y.o. male who presents with depression and anxiety symptoms. Pt did not attend group, stating he had a migraine and would be late.        Session Time: 10:00-11:00  Participation Level:Active  Behavioral Response:CasualAlertDepressed  Type of Therapy: Group Therapy, psychoeducation, psychotherapy  Treatment Goals addressed: Coping  Interventions:CBT, DBT, Solution Focused, Supportive and  Reframing  Summary:Cln led discussion on recognizing the positive. Group members shared how it feels like nothing positive is happening. Cln offered challenges and brought in topics of mindfulness, irrational thought patterns, and reframing. Cln encouraged pt's to try gratitude activity over the next month as a way to retrain the brain to equalize what it notices in terms of positive and negative.   Therapist Response: Pt reports struggling with identifying the positive or thinking it matters. Pt states "I guess I can try that" in regards to gratitudes and mindfulness. Pt admits he is "skeptical."       Session Time: 11:00- 12:00  Participation Level:Active  Behavioral Response:CasualAlertDepressed  Type of Therapy: Group Therapy, Psychoeducation; Psychotherapy  Treatment Goals addressed: Coping  Interventions:CBT; Solution focused; Supportive; Reframing  Summary:Clinician introduced topic of boundaries. Cln discussed what boundaries are, why we talk about them, and provided education on the 3 boundary styles and types of boundaries. Group members discussed how they feel their boundaries are currently.    Therapist Response: Pt reports understanding of topic discussed. Pt identifies he has mainly porous boundaries and that he recognizes he is there for people who are not in turn there for him.       Session Time: 12:00 -1:00  Participation Level:Active  Behavioral Response:CasualAlertDepressed  Type of Therapy: Group Therapy, OT  Treatment Goals addressed: Coping  Interventions:Psychosocial skills training, Supportive,   Summary:12:00 - 12:50:Occupational Therapy group 12:50 -1:00 Clinician led check-out. Clinician assessed for immediate needs, medication compliance and efficacy, and safety concerns   Therapist Response:12:00 - 12:50:Patient engaged in group. See OT note.  12:50 - 1:00: At check-out, patient rates his mood at a  5.5 on a scale of 1-10 with 10 being great.Pt states he has  no specific afternoon plans.Patient demonstrates someprogress as evidenced by increased engagement in group.Patient denies SI/HI/self-harm at the end of group.      Suicidal/Homicidal: Nowithout intent/plan  Plan: Pt will continue in PHP while working to decrease depression and anxiety symptoms, increase ability to manage symptoms in a healthy manner when they arise, and decrease passive SI.   Diagnosis: Severe episode of recurrent major depressive disorder, without psychotic features ([place]) [F33.2]    1. Severe episode of recurrent major depressive disorder, without psychotic features ([place])   2. GAD (generalized anxiety disorder)       Kirk Glass, LCSW 11/22/2018

## 2018-11-23 ENCOUNTER — Encounter (HOSPITAL_COMMUNITY): Payer: Self-pay

## 2018-11-23 ENCOUNTER — Other Ambulatory Visit (HOSPITAL_COMMUNITY): Payer: BLUE CROSS/BLUE SHIELD | Admitting: Licensed Clinical Social Worker

## 2018-11-23 ENCOUNTER — Other Ambulatory Visit: Payer: Self-pay

## 2018-11-23 DIAGNOSIS — F411 Generalized anxiety disorder: Secondary | ICD-10-CM

## 2018-11-23 DIAGNOSIS — F332 Major depressive disorder, recurrent severe without psychotic features: Secondary | ICD-10-CM

## 2018-11-23 MED ORDER — TRAZODONE HCL 50 MG PO TABS: 50.0000 mg | ORAL_TABLET | Freq: Every day | ORAL | 0 refills | Status: AC

## 2018-11-23 NOTE — Progress Notes (Addendum)
Pt attended spiritual care group  11/23/2018  11:00-12:00.  Group met via web-ex due to COVID-19 precautions.  Group facilitated by Simone Curia, MDiv, [place]   Group focused on topic of "community."  Members reflected on topic in facilitated dialog, identifying responses to topic and notions they hold of community from their previous experience.  Group members utilized value sort cards to identify top qualities they look for in community.  Engaged in facilitated dialog around their value choices, noting origin of these values, how these are realized in their lives, and strategies for engaging these values.    Spiritual care group drew on Motivational Interviewing, Narrative and Adlerian modalities.  [name] was present throughout group.  He initially was uncertain about communities in his life, going on to describe awareness of his Jewish cultural heritage as well as similar life experiences with people in the [place] - such as going away to summer camp.  Spoke with group about how narratives about "who we are" can come from larger cultural heritage / memory, or from family stories.  He also spoke of working at [employer] and finding resonance with folks that share similar values as he.  He Identified Comfort, Compassion, family, Love / Loving, and Sef-esteem as values he holds in community.

## 2018-11-23 NOTE — Psych (Signed)
Virtual Visit via Video Note  I connected with Kirk Leon on 11/23/18 at  9:00 AM EDT by a video enabled telemedicine application and verified that I am speaking with the correct person using two identifiers.   I discussed the limitations of evaluation and management by telemedicine and the availability of in person appointments. The patient expressed understanding and agreed to proceed.  I discussed the assessment and treatment plan with the patient. The patient was provided an opportunity to ask questions and all were answered. The patient agreed with the plan and demonstrated an understanding of the instructions.   The patient was advised to call back or seek an in-person evaluation if the symptoms worsen or if the condition fails to improve as anticipated.  Pt was provided 240 minutes of non-face-to-face time during this encounter.   [name], LCSW    [facility] PHP THERAPIST PROGRESS NOTE  LADALE SHERBURN [id number]  Session Time: 9:00 - 10:00  Participation Level: Active  Behavioral Response: CasualAlertDepressed  Type of Therapy: Group Therapy  Treatment Goals addressed: Coping  Interventions: CBT, DBT, Supportive and Reframing  Summary:  Clinician led check-in regarding current stressors and situation, and review of patient completed daily inventory. Clinician utilized active listening and empathetic response and validated patient emotions. Clinician facilitated processing group on pertinent issues.   Therapist Response: JOHNWESLEY Leon is a 28 y.o. male who presents with depression and anxiety symptoms. Patient arrived within time allowed and reports that he is feeling "just sad." Patient rates his mood at a 4 on a scale of 1-10 with 10 being great. Pt reports he is struggling being away from his routine and being in quarantine and it hit him harder last night, causing increased rumination. Pt reports he did seek some distractions, but struggled to keep with them. 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 Pt reports he is trying to do more varied activities. Pt shares struggle with having periods of feeling improved and then falling back and feeling defeated. Pt able to process.  Patient engaged in discussion.      Session Time: 10:00-11:00  Participation Level:Active  Behavioral Response:CasualAlertDepressed  Type of Therapy: Group Therapy, psychoeducation, psychotherapy  Treatment Goals addressed: Coping  Interventions:CBT, DBT, Solution Focused, Supportive and Reframing  Summary:Cln led discussion on loneliness. Group members shared experiences of loneliness and how it affects them. Cln offered working definition of loneliness as "feeling disconnected" and group discussed how that did or did not connect with them. Cln suggested tents of boundaries and reframing as ways to address loneliness.   Therapist Response: Pt shares he feels "really lonely" due to his recent break-up, being isolated at his parent's house right now, and not many of his friends knowing about his mental health issues. Pt states he connects to loneliness as disconnection because he feels disconnected a lot of the time. Pt shares 2 experiences in which people he felt close to reacted negatively to pt "not being okay" and is able to recognize that this exacerbated an issue he already had with opening up. Pt states he intellectually understands he could "reach out" or talk to his friends about what he is feeling and exhibits resistance to acting on it and is unable to say why. Pt able to process.       Session Time: 11:00 -12:00  Participation Level: Active  Behavioral Response: CasualAlertDepressed  Type of  Therapy: Group Therapy, psychotherapy  Treatment Goals addressed: Coping  Interventions: Strengths based, reframing, Supportive,   Summary:  Spiritual Care  group  Therapist Response: Patient engaged in group. See chaplain note.        Session Time: 12:00- 1:00  Participation Level: Active  Behavioral Response: CasualAlertDepressed  Type of Therapy: Group Therapy, Psychoeducation  Treatment Goals addressed: Coping  Interventions: relaxation training; Supportive; Reframing  Summary: 12:00 - 12:50: Relaxation group: Cln led group focused on retraining the body's response to stress.   12:50 -1:00 Clinician led check-out. Clinician assessed for immediate needs, medication compliance and efficacy, and safety concerns   Therapist Response: Patient engaged in activity and discussion.  At check-out, patient rates his mood at a 6 on a scale of 1-10 with 10 being great. Pt states afternoon plans of video chatting with a friend, celebrating his mom's birthday, and watching a show. Patient demonstrates some progress as evidenced by attempting to try new things. Patient denies SI/HI/self-harm at the end of group.    Suicidal/Homicidal: Nowithout intent/plan  Plan: Pt will continue in PHP while working to decrease depression and anxiety symptoms, increase ability to manage symptoms in a healthy manner when they arise, and decrease passive SI.   Diagnosis: Severe episode of recurrent major depressive disorder, without psychotic features (HCC) [F33.2]    1. Severe episode of recurrent major depressive disorder, without psychotic features (HCC)   2. GAD (generalized anxiety disorder)       Kirk GuilesJenny Naima Veldhuizen, LCSW 11/23/2018

## 2018-11-23 NOTE — Addendum Note (Signed)
Addended by: [name] on: 11/23/2018 01:02 PM   Modules accepted: Orders

## 2018-11-23 NOTE — Therapy (Addendum)
[facility] [address] [place], [place], [address] Phone: [phone]   Fax:  [phone]  Occupational Therapy Treatment  Patient Details  Name: Kirk Leon [place] MRN: [id number] Date of Birth: 06/02/1990 Referring Provider (OT): Hillery Jacksanika Lewis, NP  Virtual Visit via Video Note  I connected with Kirk AbuAndrew Leon Anspaugh on 11/23/18 at  8:00 AM EDT by a video enabled telemedicine application and verified that I am speaking with the correct person using two identifiers.   I discussed the limitations of evaluation and management by telemedicine and the availability of in person appointments. The patient expressed understanding and agreed to proceed.   I discussed the assessment and treatment plan with the patient. The patient was provided an opportunity to ask questions and all were answered. The patient agreed with the plan and demonstrated an understanding of the instructions.   The patient was advised to call back or seek an in-person evaluation if the symptoms worsen or if the condition fails to improve as anticipated.  I provided 60 minutes of non-face-to-face time during this encounter.   [name] Terriyah Westra, OT    Encounter Date: 11/22/2018  OT End of Session - 11/23/18 1046    Visit Number  3    Number of Visits  12    Date for OT Re-Evaluation  12/16/18    Authorization Type  [employer]    OT Start Time  1100    OT Stop Time  1200    OT Time Calculation (min)  60 min    Activity Tolerance  Patient tolerated treatment well    Behavior During Therapy  WFL for tasks assessed/performed       Past Medical History:  Diagnosis Date  . Asthma   . Seasonal allergies     Past Surgical History:  Procedure Laterality Date  . APPENDECTOMY  2012    There were no vitals filed for this visit.  Subjective Assessment - 11/23/18 1045    Currently in Pain?  No/denies       S: My self care is poor   O: Education given on self care and  its importance in regular BADL/IADL routine. Pt completed self care assessment to identify areas of strength and weakness. Self care assessments covered areas of physical health, psychological health, spiritual health, and professional health. Pt asked to identifies area of weakness within each area and develop plans for improvement this date. Pt encouraged to brainstorm with other peers to begin goal setting in areas of desired change.   A: Pt presents with blunted affect, engaged and participatory throughout session. Pt shares how his self care is low. He shares that it has been worse since having to quarantine from having a positive COVID diagnosis. He shares how he would like to complete more positive physical self care and find positive hobbies to engage in. He would also like to engage in some exercise, and enjoys yoga. Will continue self care education next date.   P: OT group will be x3 per week while pt in PHP                 OT Education - 11/23/18 1046    Education Details  education given on self care    Person(s) Educated  Patient    Methods  Explanation;Handout    Comprehension  Verbalized understanding       OT Short Term Goals - 11/18/18 1147      OT SHORT TERM GOAL #  1   Title  Pt will be educated on strategies to improve psychosocial skills needed to participate fully in all daily, work, and leisure activities    Time  4    Period  Weeks    Status  On-going    Target Date  12/16/18      OT SHORT TERM GOAL #2   Title  Pt will apply psychosocial skills and coping mechanisms to daily activities in order to function independently and reintegrate into community    Time  4    Period  Weeks    Status  On-going      OT SHORT TERM GOAL #3   Title  Pt will recall and/or apply 1-3 sleep hygiene strategies to improve function in BADL routine upon reintegrating into community    Time  4    Period  Weeks    Status  On-going      OT SHORT TERM GOAL #4   Title  Pt  will recall 3-5 RTW and school skills needed for successful reintegration into community    Time  4    Period  Weeks    Status  On-going      OT SHORT TERM GOAL #5   Title  Pt will engage in goal setting to improve functional BADL/IADL routine upon reintegrating into community.    Time  4    Period  Weeks    Status  On-going               Plan - 11/23/18 1047    Occupational performance deficits (Please refer to evaluation for details):  ADL's;IADL's;Rest and Sleep;Education;Work;Leisure;Social Participation    Body Structure / Function / Physical Skills  ADL;IADL    Cognitive Skills  Energy/Drive;Emotional    Psychosocial Skills  Coping Strategies;Interpersonal Interaction;Routines and Behaviors       Patient will benefit from skilled therapeutic intervention in order to improve the following deficits and impairments:   Body Structure / Function / Physical Skills: ADL, IADL Cognitive Skills: Energy/Drive, Emotional Psychosocial Skills: Coping Strategies, Interpersonal Interaction, Routines and Behaviors   Visit Diagnosis: Severe episode of recurrent major depressive disorder, without psychotic features (HCC)  GAD (generalized anxiety disorder)  Difficulty coping    Problem List Patient Active Problem List   Diagnosis Date Noted  . Suicidal ideation 11/10/2018   [name], MSOT, OTR/L Behavioral Health OT/ Acute Relief OT PHP Office: [place] 11/23/2018, 10:48 AM  [facility] HOSPITALIZATION PROGRAM [place], [place], [address] Phone: [phone]   Fax:  [phone]  Name: NICHOALS HEYDE MRN: [id number] Date of Birth: 1991/01/31

## 2018-11-24 ENCOUNTER — Other Ambulatory Visit: Payer: Self-pay

## 2018-11-24 ENCOUNTER — Other Ambulatory Visit (HOSPITAL_COMMUNITY): Payer: BLUE CROSS/BLUE SHIELD | Admitting: Occupational Therapy

## 2018-11-24 ENCOUNTER — Other Ambulatory Visit (HOSPITAL_COMMUNITY): Payer: BLUE CROSS/BLUE SHIELD | Admitting: Licensed Clinical Social Worker

## 2018-11-24 DIAGNOSIS — F411 Generalized anxiety disorder: Secondary | ICD-10-CM

## 2018-11-24 DIAGNOSIS — F332 Major depressive disorder, recurrent severe without psychotic features: Secondary | ICD-10-CM

## 2018-11-24 DIAGNOSIS — R4589 Other symptoms and signs involving emotional state: Secondary | ICD-10-CM

## 2018-11-25 ENCOUNTER — Other Ambulatory Visit (HOSPITAL_COMMUNITY): Payer: BLUE CROSS/BLUE SHIELD | Admitting: Occupational Therapy

## 2018-11-25 ENCOUNTER — Encounter (HOSPITAL_COMMUNITY): Payer: Self-pay

## 2018-11-25 ENCOUNTER — Other Ambulatory Visit: Payer: Self-pay

## 2018-11-25 ENCOUNTER — Other Ambulatory Visit (HOSPITAL_COMMUNITY): Payer: BLUE CROSS/BLUE SHIELD | Admitting: Licensed Clinical Social Worker

## 2018-11-25 DIAGNOSIS — F332 Major depressive disorder, recurrent severe without psychotic features: Secondary | ICD-10-CM

## 2018-11-25 DIAGNOSIS — F411 Generalized anxiety disorder: Secondary | ICD-10-CM

## 2018-11-25 DIAGNOSIS — R4589 Other symptoms and signs involving emotional state: Secondary | ICD-10-CM

## 2018-11-25 NOTE — Psych (Signed)
Virtual Visit via Video Note  I connected with Kirk Leon on 11/25/18 at  9:00 AM EDT by a video enabled telemedicine application and verified that I am speaking with the correct person using two identifiers.   I discussed the limitations of evaluation and management by telemedicine and the availability of in person appointments. The patient expressed understanding and agreed to proceed.  I discussed the assessment and treatment plan with the patient. The patient was provided an opportunity to ask questions and all were answered. The patient agreed with the plan and demonstrated an understanding of the instructions.   The patient was advised to call back or seek an in-person evaluation if the symptoms worsen or if the condition fails to improve as anticipated.  Pt was provided 240 minutes of non-face-to-face time during this encounter.   Donia GuilesJenny Adlyn Fife, LCSW    [facility] PHP THERAPIST PROGRESS NOTE  Kirk Leon [id number]  Session Time: 9:00 - 10:00  Participation Level: Active  Behavioral Response: CasualAlertDepressed  Type of Therapy: Group Therapy  Treatment Goals addressed: Coping  Interventions: CBT, DBT, Supportive and Reframing  Summary:  Clinician led check-in regarding current stressors and situation, and review of patient completed daily inventory. Clinician utilized active listening and empathetic response and validated patient emotions. Clinician facilitated processing group on pertinent issues.   Therapist Response: Kirk Leon is a 28 y.o. male who presents with depression and anxiety symptoms. Patient arrived within time allowed and reports that he is feeling "down." Patient rates his mood at a 4.5 on a scale of 1-10 with 10 being great. Pt reports he played basketball with his dad and watched tv and sports. Pt shares he sat inside with his family and felt overwhelmed when his parents began to ask him about his plans moving forward. Pt states bought virtual  classes in visual editing, guitar playing, and drawing. Pt reports he is putting effort into keeping busy. Pt reports he continues to struggle with concentration and rumination. Pt able to process.  Patient engaged in discussion.      Session Time: 10:00 -11:00  Participation Level: Active  Behavioral Response: CasualAlertDepressed  Type of Therapy: Group Therapy, psychoeducation, psychotherapy  Treatment Goals addressed: Coping  Interventions: CBT, DBT, Solution Focused, Supportive and Reframing  Summary: Cln led discussion on communication issues within relationships. Cln brought in "I" statements and fair fighting concepts.   Therapist Response: Pt reports struggling with communication in terms of not feeling comfortable sharing his struggles with close friends and over-sharing in romantic relationships.         Session Time: 11:00- 12:00  Participation Level: Active  Behavioral Response: CasualAlertDepressed  Type of Therapy: Group Therapy, Psychoeducation; Psychotherapy  Treatment Goals addressed: Coping  Interventions: CBT; Solution focused; Supportive; Reframing  Summary: Cln led boundary workshop. Cln discussed how to set and maintain boundaries. Group members discussed boundary issues from their lives and group provided feedback and problem solving based on boundary education.   Therapist Response: Patient engaged in activity. Pt shared boundary issues with giving too much to his ex. Pt accepted and provided feedback and reports increased understanding of how to set and maintain boundaries.       Session Time: 12:00 -1:00  Participation Level:Active  Behavioral Response:CasualAlertDepressed  Type of Therapy: Group Therapy, OT  Treatment Goals addressed: Coping  Interventions:Psychosocial skills training, Supportive,   Summary:12:00 - 12:50:Occupational Therapy group 12:50 -1:00 Clinician led check-out. Clinician  assessed for immediate needs, medication compliance and efficacy, and  safety concerns   Therapist Response: 12:00 - 12:50: Patient engaged in group. See OT note.  12:50 - 1:00: At check-out, patient rates his mood at a 6 on a scale of 1-10 with 10 being great. Pt states afternoon plans of laying basketball and working on a virtual class. Patient demonstrates some progress as evidenced by seeking out new opportunities. Patient denies SI/HI/self-harm at the end of group.     Suicidal/Homicidal: Nowithout intent/plan  Plan: Pt will continue in PHP while working to decrease depression and anxiety symptoms, increase ability to manage symptoms in a healthy manner when they arise, and decrease passive SI.   Diagnosis: Severe episode of recurrent major depressive disorder, without psychotic features ([place]) [F33.2]    1. Severe episode of recurrent major depressive disorder, without psychotic features ([place])   2. GAD (generalized anxiety disorder)       [name], LCSW 11/25/2018

## 2018-11-25 NOTE — Therapy (Signed)
[place], [place], [address] Phone: [phone]   Fax:  [phone]  Occupational Therapy Treatment  Patient Details  Name: Kirk Leon MRN: [id number] Date of Birth: 01/04/1991 Referring Provider (OT): [name], NP  Virtual Visit via Video Note  I connected with Kirk Leon on 11/25/18 at  8:00 AM EDT by a video enabled telemedicine application and verified that I am speaking with the correct person using two identifiers.   I discussed the limitations of evaluation and management by telemedicine and the availability of in person appointments. The patient expressed understanding and agreed to proceed.   I discussed the assessment and treatment plan with the patient. The patient was provided an opportunity to ask questions and all were answered. The patient agreed with the plan and demonstrated an understanding of the instructions.   The patient was advised to call back or seek an in-person evaluation if the symptoms worsen or if the condition fails to improve as anticipated.  I provided 60 minutes of non-face-to-face time during this encounter.   [name], OT    Encounter Date: 11/24/2018  OT End of Session - 11/25/18 0943    Visit Number  4    Number of Visits  12    Date for OT Re-Evaluation  12/16/18    Authorization Type  [employer]    OT Start Time  1100    OT Stop Time  1200    OT Time Calculation (min)  60 min    Activity Tolerance  Patient tolerated treatment well    Behavior During Therapy  WFL for tasks assessed/performed       Past Medical History:  Diagnosis Date  . Asthma   . Seasonal allergies     Past Surgical History:  Procedure Laterality Date  . APPENDECTOMY  2012    There were no vitals filed for this visit.  Subjective Assessment - 11/25/18 0943    Currently in Pain?  No/denies          S: My professional self care is very low right now  O:  Continued education given on self care as described in previous session.  A: Pt presents with depressed affect, engaged and participatory. He shares how his spiritual self care and profesional self care are low right now. He shares that he would like to start meditating and spending more time in nature, considering both of those things bring him happiness. He would also like to begin to explore the idea of what he would like to continue with in school/focus his career path on.  P: OT group will be x3 while pt in [place]             OT Education - 11/25/18 0943    Education Details  cont education given on self care    Person(s) Educated  Patient    Methods  Explanation;Handout    Comprehension  Verbalized understanding       OT Short Term Goals - 11/18/18 1147      OT SHORT TERM GOAL #1   Title  Pt will be educated on strategies to improve psychosocial skills needed to participate fully in all daily, work, and leisure activities    Time  4    Period  Weeks    Status  On-going    Target Date  12/16/18      OT SHORT TERM GOAL #2   Title  Pt  will apply psychosocial skills and coping mechanisms to daily activities in order to function independently and reintegrate into community    Time  4    Period  Weeks    Status  On-going      OT SHORT TERM GOAL #3   Title  Pt will recall and/or apply 1-3 sleep hygiene strategies to improve function in BADL routine upon reintegrating into community    Time  4    Period  Weeks    Status  On-going      OT SHORT TERM GOAL #4   Title  Pt will recall 3-5 RTW and school skills needed for successful reintegration into community    Time  4    Period  Weeks    Status  On-going      OT SHORT TERM GOAL #5   Title  Pt will engage in goal setting to improve functional BADL/IADL routine upon reintegrating into community.    Time  4    Period  Weeks    Status  On-going               Plan - 11/25/18 0944    Occupational performance  deficits (Please refer to evaluation for details):  ADL's;IADL's;Rest and Sleep;Education;Work;Leisure;Social Participation    Body Structure / Function / Physical Skills  ADL;IADL    Cognitive Skills  Energy/Drive;Emotional    Psychosocial Skills  Coping Strategies;Interpersonal Interaction;Routines and Behaviors       Patient will benefit from skilled therapeutic intervention in order to improve the following deficits and impairments:   Body Structure / Function / Physical Skills: ADL, IADL Cognitive Skills: Energy/Drive, Emotional Psychosocial Skills: Coping Strategies, Interpersonal Interaction, Routines and Behaviors   Visit Diagnosis: Severe episode of recurrent major depressive disorder, without psychotic features (HCC)  GAD (generalized anxiety disorder)  Difficulty coping    Problem List Patient Active Problem List   Diagnosis Date Noted  . Suicidal ideation 11/10/2018   [name] Jacobie Stamey, MSOT, OTR/L Behavioral Health OT/ Acute Relief OT PHP Office: [phone]  [name] Darvis Croft 11/25/2018, 9:44 AM  [facility] [address] [place], [place], [address] Phone: [phone]   Fax:  [phone]  Name: Kirk Leon MRN: [id number] Date of Birth: 01/09/1991

## 2018-11-25 NOTE — Psych (Signed)
Virtual Visit via Video Note  I connected with Kirk Leon on 11/24/18 at  9:00 AM EDT by a video enabled telemedicine application and verified that I am speaking with the correct person using two identifiers.   I discussed the limitations of evaluation and management by telemedicine and the availability of in person appointments. The patient expressed understanding and agreed to proceed.  I discussed the assessment and treatment plan with the patient. The patient was provided an opportunity to ask questions and all were answered. The patient agreed with the plan and demonstrated an understanding of the instructions.   The patient was advised to call back or seek an in-person evaluation if the symptoms worsen or if the condition fails to improve as anticipated.  Pt was provided 240 minutes of non-face-to-face time during this encounter.   Kirk GuilesJenny Kaius Daino, LCSW    [facility] THERAPIST PROGRESS NOTE  Kirk Leon [id number]  Session Time: 9:00 - 10:00  Participation Level: Active  Behavioral Response: CasualAlertDepressed  Type of Therapy: Group Therapy  Treatment Goals addressed: Coping  Interventions: CBT, DBT, Supportive and Reframing  Summary:  Clinician led check-in regarding current stressors and situation, and review of patient completed daily inventory. Clinician utilized active listening and empathetic response and validated patient emotions. Clinician facilitated processing group on pertinent issues.   Therapist Response: Kirk Abundrew J [name] is a 28 y.o. male who presents with depression and anxiety symptoms. Patient arrived within time allowed and reports that he is feeling "down." Patient rates his mood at a 4 on a scale of 1-10 with 10 being great. Pt reports he did not sleep well last night due to ruminating. Pt states yesterday went well and he edited another video, played basketball with his dad, video chatted with a friend, and had dinner with his family. Pt shares  struggle with discussing struggles with his friends and desire to grow more comfortable doing so. Pt able to process.  Patient engaged in discussion.      Session Time: 10:00 -11:00  Participation Level: Active  Behavioral Response: CasualAlertDepressed  Type of Therapy: Group Therapy, psychoeducation, psychotherapy  Treatment Goals addressed: Coping  Interventions: CBT, DBT, Solution Focused, Supportive and Reframing  Summary: Cln led discussion on building trust in relationships and establishing new and healthy relationships. Group members shared ways in which they struggle with relationships and processed.   Therapist Response: Pt reports he struggles with trust since his last relationship ended because he feels he was intentionally misled by his ex. Pt able to process.        Session Time: 11:00- 12:00  Participation Level: Active  Behavioral Response: CasualAlertDepressed  Type of Therapy: Group Therapy, Psychoeducation; Psychotherapy  Treatment Goals addressed: Coping  Interventions: CBT; Solution focused; Supportive; Reframing  Summary: Cln continued topic of boundaries and reviewed previous day topic. Cln discussed how to set and maintain boundaries. Cln utilized handouts "How to set a boundary" and group utilized examples to practice hypothetical situations.  Therapist Response: Patient engaged in activity. Pt demonstrated understanding of how to set a boundary by participating in examples.      Session Time: 12:00 -1:00  Participation Level:Active  Behavioral Response:CasualAlertDepressed  Type of Therapy: Group Therapy, OT  Treatment Goals addressed: Coping  Interventions:Psychosocial skills training, Supportive,   Summary:12:00 - 12:50:Occupational Therapy group 12:50 -1:00 Clinician led check-out. Clinician assessed for immediate needs, medication compliance and efficacy, and safety concerns   Therapist  Response: 12:00 - 12:50: Patient engaged in group. See  OT note.  12:50 - 1:00: At check-out, patient rates his mood at a 3.5 on a scale of 1-10 with 10 being great. Pt states afternoon plans of taking a nap and spending time with family. Patient demonstrates some progress as evidenced by increased openness. Patient denies SI/HI/self-harm at the end of group.     Suicidal/Homicidal: Nowithout intent/plan  Plan: Pt will continue in PHP while working to decrease depression and anxiety symptoms, increase ability to manage symptoms in a healthy manner when they arise, and decrease passive SI.   Diagnosis: Severe episode of recurrent major depressive disorder, without psychotic features ([place]) [F33.2]    1. Severe episode of recurrent major depressive disorder, without psychotic features ([place])   2. GAD (generalized anxiety disorder)       Kirk Glass, LCSW 11/25/2018

## 2018-11-28 ENCOUNTER — Other Ambulatory Visit: Payer: Self-pay

## 2018-11-28 ENCOUNTER — Encounter (HOSPITAL_COMMUNITY): Payer: Self-pay | Admitting: Occupational Therapy

## 2018-11-28 ENCOUNTER — Other Ambulatory Visit (HOSPITAL_COMMUNITY): Payer: BLUE CROSS/BLUE SHIELD | Admitting: Licensed Clinical Social Worker

## 2018-11-28 ENCOUNTER — Other Ambulatory Visit (HOSPITAL_COMMUNITY): Payer: Self-pay

## 2018-11-28 ENCOUNTER — Encounter (HOSPITAL_COMMUNITY): Payer: Self-pay | Admitting: Family

## 2018-11-28 DIAGNOSIS — F411 Generalized anxiety disorder: Secondary | ICD-10-CM

## 2018-11-28 DIAGNOSIS — Z79899 Other long term (current) drug therapy: Secondary | ICD-10-CM

## 2018-11-28 DIAGNOSIS — R4589 Other symptoms and signs involving emotional state: Secondary | ICD-10-CM

## 2018-11-28 DIAGNOSIS — F332 Major depressive disorder, recurrent severe without psychotic features: Secondary | ICD-10-CM

## 2018-11-28 MED ORDER — QUETIAPINE FUMARATE 25 MG PO TABS: 25.0000 mg | ORAL_TABLET | Freq: Every day | ORAL | 0 refills | Status: DC

## 2018-11-28 NOTE — Progress Notes (Signed)
Virtual Visit via Video Note  I connected with Kirk Leon on 11/28/18 at  9:00 AM EDT by a video enabled telemedicine application and verified that I am speaking with the correct person using two identifiers.   I discussed the limitations of evaluation and management by telemedicine and the availability of in person appointments. The patient expressed understanding and agreed to proceed.  I discussed the assessment and treatment plan with the patient. The patient was provided an opportunity to ask questions and all were answered. The patient agreed with the plan and demonstrated an understanding of the instructions.   The patient was advised to call back or seek an in-person evaluation if the symptoms worsen or if the condition fails to improve as anticipated.  I provided 000 minutes of non-face-to-face time during this encounter.   [name], NP   BH MD/PA/NP OP Progress Note  11/28/2018 9:49 AM ZENON LEAF  MRN:  [id number]   Evaluation: Kirk Leon seen via WebEx.  He is awake, alert and oriented x3.  Reporting worsening depression and anxiety.  Continues to express passive suicidal ideations however denies intent or plan.  Reports " over the weekend I had a disassociation for a brief moment."  Patient has continue concerns with medication side effects.  As he reports nausea and GI upset. "  Queasy" patient reports recent hair loss.  Reports he attributes it to stressors.  Auren reports ongoing ruminations.  Discussed patient to follow-up in office for basic labs.  Patient to discontinue Ambien and trazodone.  will initiate Seroquel 25 mg nightly.  Patient to decrease Wellbutrin 300 mg to 150 mg p.o. daily.  Bijon  continues to rate his anxiety 8 out of 10 with 10 being the worst.  Patient reports slight apprehension with trying new medications.  Support, encouragement and  reassurance was provided.  History: Kirk Leon is a 28 y.o. Caucasian male presents with depression and  suicidal ideations.  Currently denying intent or plan.  Reports a longstanding history with depression and anxiety.  Reports for the past 10 years he has been struggling with depression.  Reports more recently he has experienced a break-up with his girlfriend of 1-1/2 years.  Reports recent diagnosis with COVID.  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  Patient reports feeling " free as he was traveling on the [place]" reports feelings of hopelessness worthlessness.  Reports intermittent bouts of depression.  Patient is followed by [facility] psychiatrist who recently initiated Wellbutrin and trazodone for reported symptoms.  Patient validates information provided in the below assessment.  Patient was enrolled in partial psychiatric program on 11/16/18.  Visit Diagnosis:    ICD-10-CM   1. Severe episode of recurrent major depressive disorder, without psychotic features ([place])  F33.2   2. GAD (generalized anxiety disorder)  F41.1   3. Difficulty coping  R45.89     Past Psychiatric History  Past Medical History:  Past Medical History:  Diagnosis Date  . Asthma   . Seasonal allergies     Past Surgical History:  Procedure Laterality Date  . APPENDECTOMY  2012    Family Psychiatric History:   Family History:  Family History  Problem Relation Age of Onset  . Depression Maternal Aunt     Social History:  Social History   Socioeconomic History  . Marital status: Single    Spouse name: Not on file  . Number of children: Not on file  .  Years of education: Not on file  . Highest education level: Not on file  Occupational History  . Not on file  Social Needs  . Financial resource strain: Not hard at all  . Food insecurity    Worry: Never true    Inability: Never true  . Transportation needs    Medical: No    Non-medical: No  Tobacco Use  . Smoking status: Never Smoker  . Smokeless tobacco: Never Used  Substance and  Sexual Activity  . Alcohol use: Yes    Comment: 4-5 mixed drinks 1-2 weekly  . Drug use: Yes    Types: Marijuana    Comment: uses on occasion  . Sexual activity: Not on file  Lifestyle  . Physical activity    Days per week: 7 days    Minutes per session: 30 min  . Stress: Very much  Relationships  . Social connections    Talks on phone: More than three times a week    Gets together: More than three times a week    Attends religious service: More than 4 times per year    Active member of club or organization: Yes    Attends meetings of clubs or organizations: More than 4 times per year    Relationship status: Never married  Other Topics Concern  . Not on file  Social History Narrative  . Not on file    Allergies:  Allergies  Allergen Reactions  . Sulfa Antibiotics Hives    Metabolic Disorder Labs: No results found for: HGBA1C, MPG No results found for: PROLACTIN No results found for: CHOL, TRIG, HDL, CHOLHDL, VLDL, LDLCALC No results found for: TSH  Therapeutic Level Labs: No results found for: LITHIUM No results found for: VALPROATE No components found for:  CBMZ  Current Medications: Current Outpatient Medications  Medication Sig Dispense Refill  . buPROPion (WELLBUTRIN XL) 300 MG 24 hr tablet Take 1 tablet (300 mg total) by mouth every morning. 30 tablet 2  . hydrOXYzine (ATARAX/VISTARIL) 25 MG tablet Take 1 tablet (25 mg total) by mouth 3 (three) times daily as needed for anxiety. 42 tablet 0  . QUEtiapine (SEROQUEL) 25 MG tablet Take 1 tablet (25 mg total) by mouth at bedtime. 30 tablet 0  . traZODone (DESYREL) 50 MG tablet Take 50 mg by mouth at bedtime.    . traZODone (DESYREL) 50 MG tablet Take 1 tablet (50 mg total) by mouth at bedtime. 30 tablet 0  . zolpidem (AMBIEN CR) 12.5 MG CR tablet Take 1 tablet (12.5 mg total) by mouth at bedtime as needed for sleep. (Patient not taking: Reported on 11/22/2018) 30 tablet 1   No current facility-administered  medications for this visit.      Musculoskeletal: Strength & Muscle Tone: within normal limits Gait & Station: normal Patient leans: N/A  Psychiatric Specialty Exam: ROS  There were no vitals taken for this visit.There is no height or weight on file to calculate BMI.  General Appearance: Guarded  Eye Contact:  Fair  Speech:  Clear and Coherent  Volume:  Normal  Mood:  Anxious, Depressed and Irritable  Affect:  Congruent  Thought Process:  Coherent  Orientation:  Full (Time, Place, and Person)  Thought Content: Logical   Suicidal Thoughts:  No passive denies intent or plan  Homicidal Thoughts:  No  Memory:  Immediate;   Fair Recent;   Fair  Judgement:  Fair  Insight:  Fair  Psychomotor Activity:  Normal  Concentration:  Concentration:  Fair  Recall:  [employer] of Knowledge: Fair  Language: Fair  Akathisia:  No  Handed:  Right  AIMS (if indicated):   Assets:  Communication Skills Desire for Improvement Resilience Social Support  ADL's:  Intact  Cognition: WNL  Sleep:  Fair   Screenings: AIMS     Ad[facility] (Dis[facility]) [facility]  AIMS Total Score  0    AUDIT     Admission (Discharged) from [facility]  Alcohol Use Disorder Identification Test Final Score (AUDIT)  2    PHQ2-9     Counselor from 11/18/2018 in [facility]  PHQ-2 Total Score  6  PHQ-9 Total Score  22       Assessment and Plan:   Continue with Partial Hospitalization -Follow-up 11/28/2018 with lab in offices- CBC, CMP, TSH, Prolactin, A1C  Decreased Wellbutrin 300 mg to 150 mg  Patient to  discontinue  trazodone and start Seroquel 25 mg po QHS ( titration)   Discontinued Ambien 5 mg    Treatment plan was reviewed and agreed upon by NP T. Xai Frerking inpatient Micah Noel need for continued group services   Oneta Rack,  NP 11/28/2018, 9:49 AM

## 2018-11-28 NOTE — Therapy (Signed)
[address] [place], [place], [address] Phone: [phone]   Fax:  [phone]  Occupational Therapy Treatment  Patient Details  Name: Kirk Leon MRN: [id number] Date of Birth: 09/07/1990 Referring Provider (OT): [name], NP  Virtual Visit via Video Note  I connected with Kirk Leon on 11/28/18 at  8:00 AM EDT by a video enabled telemedicine application and verified that I am speaking with the correct person using two identifiers.   I discussed the limitations of evaluation and management by telemedicine and the availability of in person appointments. The patient expressed understanding and agreed to proceed.  I discussed the assessment and treatment plan with the patient. The patient was provided an opportunity to ask questions and all were answered. The patient agreed with the plan and demonstrated an understanding of the instructions.   The patient was advised to call back or seek an in-person evaluation if the symptoms worsen or if the condition fails to improve as anticipated.  I provided 60 minutes of non-face-to-face time during this encounter.   [name], OT   Encounter Date: 11/25/2018  OT End of Session - 11/28/18 2008    Visit Number  5    Number of Visits  12    Date for OT Re-Evaluation  12/16/18    Authorization Type  [employer]    OT Start Time  1100    OT Stop Time  1200    OT Time Calculation (min)  60 min    Activity Tolerance  Patient tolerated treatment well    Behavior During Therapy  WFL for tasks assessed/performed       Past Medical History:  Diagnosis Date  . Asthma   . Seasonal allergies     Past Surgical History:  Procedure Laterality Date  . APPENDECTOMY  2012    There were no vitals filed for this visit.  Subjective Assessment - 11/28/18 2007    Currently in Pain?  No/denies           S: I am more passive   O: Education and activities given  in reference to increase assertiveness skills within daily life and relationships. Further education given on assertive conversation, situations, body language, and appropriate context for skill. Pt asked to identify one area to increase assertiveness this date. Further education given on the importance of assertiveness mentors and online research to continue skill building in this area increase quality of life.   A: Pt presents with flat affect, engaged and participatory. He shares how he is mostly passive. He would like to learn to be more assertive in romantic relationships and work situations. He shares how others often tell him he is passive as well. Pt shares assertiveness mentor and strategies for improvement.  P: OT group will be x3 per week while pt in PHP             OT Education - 11/28/18 2008    Education Details  education given on assertiveness skills    Person(s) Educated  Patient    Methods  Explanation;Handout    Comprehension  Verbalized understanding       OT Short Term Goals - 11/18/18 1147      OT SHORT TERM GOAL #1   Title  Pt will be educated on strategies to improve psychosocial skills needed to participate fully in all daily, work, and leisure activities    Time  4    Period  Weeks    Status  On-going    Target Date  12/16/18      OT SHORT TERM GOAL #2   Title  Pt will apply psychosocial skills and coping mechanisms to daily activities in order to function independently and reintegrate into community    Time  4    Period  Weeks    Status  On-going      OT SHORT TERM GOAL #3   Title  Pt will recall and/or apply 1-3 sleep hygiene strategies to improve function in BADL routine upon reintegrating into community    Time  4    Period  Weeks    Status  On-going      OT SHORT TERM GOAL #4   Title  Pt will recall 3-5 RTW and school skills needed for successful reintegration into community    Time  4    Period  Weeks    Status  On-going      OT  SHORT TERM GOAL #5   Title  Pt will engage in goal setting to improve functional BADL/IADL routine upon reintegrating into community.    Time  4    Period  Weeks    Status  On-going               Plan - 11/28/18 2009    Occupational performance deficits (Please refer to evaluation for details):  ADL's;IADL's;Rest and Sleep;Education;Work;Leisure;Social Participation    Body Structure / Function / Physical Skills  ADL;IADL    Cognitive Skills  Energy/Drive;Emotional    Psychosocial Skills  Coping Strategies;Interpersonal Interaction;Routines and Behaviors       Patient will benefit from skilled therapeutic intervention in order to improve the following deficits and impairments:   Body Structure / Function / Physical Skills: ADL, IADL Cognitive Skills: Energy/Drive, Emotional Psychosocial Skills: Coping Strategies, Interpersonal Interaction, Routines and Behaviors   Visit Diagnosis: Severe episode of recurrent major depressive disorder, without psychotic features ([facility])  GAD (generalized anxiety disorder)  Difficulty coping    Problem List Patient Active Problem List   Diagnosis Date Noted  . Suicidal ideation 11/10/2018   [name] Rochell Puett, MSOT, OTR/L Behavioral Health OT/ Acute Relief OT PHP Office: [phone]  [name] Marni Franzoni 11/28/2018, 8:09 PM  [facility] [address] [place], [place], [address] Phone: [phone]   Fax:  [phone]  Name: Kirk Leon MRN: [id number] Date of Birth: 11/27/1990

## 2018-11-28 NOTE — Psych (Signed)
Virtual Visit via Video Note  I connected with Kirk Leon on 11/28/18 at  9:00 AM EDT by a video enabled telemedicine application and verified that I am speaking with the correct person using two identifiers.   I discussed the limitations of evaluation and management by telemedicine and the availability of in person appointments. The patient expressed understanding and agreed to proceed.  I discussed the assessment and treatment plan with the patient. The patient was provided an opportunity to ask questions and all were answered. The patient agreed with the plan and demonstrated an understanding of the instructions.   The patient was advised to call back or seek an in-person evaluation if the symptoms worsen or if the condition fails to improve as anticipated.  Pt was provided 240 minutes of non-face-to-face time during this encounter.   [name], LCSW    [facility] BH PHP THERAPIST PROGRESS NOTE  Kirk Leon [id number]  Session Time: 9:00 - 10:00  Participation Level: Active  Behavioral Response: CasualAlertDepressed  Type of Therapy: Group Therapy  Treatment Goals addressed: Coping  Interventions: CBT, DBT, Supportive and Reframing  Summary:  Clinician led check-in regarding current stressors and situation, and review of patient completed daily inventory. Clinician utilized active listening and empathetic response and validated patient emotions. Clinician facilitated processing group on pertinent issues.   Therapist Response: Kirk Leon is a 28 y.o. male who presents with depression and anxiety symptoms. Patient arrived within time allowed and reports that he is feeling "not good." Patient rates his mood at a 2.5 on a scale of 1-10 with 10 being great. Pt reports weekend's are "particularly hard" for him due to the lack of structure and having too much time alone with his thoughts. Pt states he watched sports and tv with his dad, went on a walk and played basketball,  and worked on his video editing class. Pt states over the weekend he felt as though there was a disconnect between what he saw and what was there and that his hair is "coming out in clumps." Pt shares this has been a stress reaction for him before but it is worse this time. Pt shares frustration due to not being as far along as he wants to be mood wise. Pt shares that he can see "a tiny bit of progress" when he is doing better but loses sight of it when his mood drops. Pt able to process. Patient engaged in discussion.      Session Time: 10:00 -11:00  Participation Level: Pt did not participate  Behavioral Response: CasualAlertDepressed  Type of Therapy: Group Therapy, psychoeducation, psychotherapy  Treatment Goals addressed: Coping  Interventions: CBT, DBT, Solution Focused, Supportive and Reframing  Summary: Cln led discussion on motivation. Group members discussed issues with motivation. Cln had pt's explore what they felt last time they were motivated and seek to recapture or address those feelings instead of waiting for motivation to "happen."   Therapist Response: Patient did not participate in session.       Session Time: 11:00- 12:00  Participation Level: Active  Behavioral Response: CasualAlertDepressed  Type of Therapy: Group Therapy, Psychoeducation; Psychotherapy  Treatment Goals addressed: Coping  Interventions: CBT; DBT; Solution focused; Supportive; Reframing  Summary: Clinician introduced topic of cognitive distortions and educated on what cognitive distortions are and how they affect [place]. Cln began review of the types of cognitive distortions utilizing the handout "Cognitive Distortions." Group members determined examples of how the distortions play out and effect their lives.  Therapist Response: Pt reports understanding of cognitive distortions and reports catastrophizing, personalization, and mind reading are particularly problematic for  him.       Session Time: 12:00 -1:00  Participation Level: Active  Behavioral Response: CasualAlertDepressed  Type of Therapy: Group Therapy, Psychoeducation; Psychotherapy  Treatment Goals addressed: Coping  Interventions: CBT; DBT; Solution focused; Supportive; Reframing  Summary:12:00 - 12:50:Clinician continued topic of cognitive distortions utilizing the handout "Common Unhealthy Thought Patterns." Group members determined examples of how the distortions play out and effect their lives.  12:50 -1:00 Clinician led check-out. Clinician assessed for immediate needs, medication compliance and efficacy, and safety concerns   Therapist Response: 12:00 - 12:50: Pt reports self-centeredness and fallacy of fairness as problematic for him.  12:50 - 1:00: At check-out, patient rates his mood at a 4 on a scale of 1-10 with 10 being great. Pt states afternoon plans of getting lab work done, playing basketball, watching a show, and working on his video editing. Patient demonstrates some progress as evidenced by continued distraction efforts. Patient denies SI/HI/self-harm at the end of group.    Suicidal/Homicidal: Nowithout intent/plan  Plan: Pt will continue in PHP while working to decrease depression and anxiety symptoms, increase ability to manage symptoms in a healthy manner when they arise, and decrease passive SI.   Diagnosis: Severe episode of recurrent major depressive disorder, without psychotic features ([place]) [F33.2]    1. Severe episode of recurrent major depressive disorder, without psychotic features ([place])   2. GAD (generalized anxiety disorder)   3. Difficulty coping       [name], LCSW 11/28/2018

## 2018-11-29 ENCOUNTER — Other Ambulatory Visit (HOSPITAL_COMMUNITY): Payer: BLUE CROSS/BLUE SHIELD | Admitting: Occupational Therapy

## 2018-11-29 ENCOUNTER — Other Ambulatory Visit (HOSPITAL_COMMUNITY): Payer: BLUE CROSS/BLUE SHIELD | Admitting: Licensed Clinical Social Worker

## 2018-11-29 ENCOUNTER — Other Ambulatory Visit: Payer: Self-pay

## 2018-11-29 DIAGNOSIS — R4589 Other symptoms and signs involving emotional state: Secondary | ICD-10-CM

## 2018-11-29 DIAGNOSIS — F332 Major depressive disorder, recurrent severe without psychotic features: Secondary | ICD-10-CM

## 2018-11-29 DIAGNOSIS — F411 Generalized anxiety disorder: Secondary | ICD-10-CM

## 2018-11-29 LAB — CBC WITH DIFFERENTIAL/PLATELET
Basophils Absolute: 0 10*3/uL (ref 0.0–0.2)
Basos: 1 %
EOS (ABSOLUTE): 0.3 10*3/uL (ref 0.0–0.4)
Eos: 4 %
Hematocrit: 49.2 % (ref 37.5–51.0)
Hemoglobin: 17.4 g/dL (ref 13.0–17.7)
Immature Grans (Abs): 0 10*3/uL (ref 0.0–0.1)
Immature Granulocytes: 0 %
Lymphocytes Absolute: 1.8 10*3/uL (ref 0.7–3.1)
Lymphs: 22 %
MCH: 29.7 pg (ref 26.6–33.0)
MCHC: 35.4 g/dL (ref 31.5–35.7)
MCV: 84 fL (ref 79–97)
Monocytes Absolute: 0.6 10*3/uL (ref 0.1–0.9)
Monocytes: 7 %
Neutrophils Absolute: 5.3 10*3/uL (ref 1.4–7.0)
Neutrophils: 66 %
Platelets: 280 10*3/uL (ref 150–450)
RBC: 5.85 x10E6/uL — ABNORMAL HIGH (ref 4.14–5.80)
RDW: 11.9 % (ref 11.6–15.4)
WBC: 7.9 10*3/uL (ref 3.4–10.8)

## 2018-11-29 LAB — LIPID PANEL WITH LDL/HDL RATIO
Cholesterol, Total: 140 mg/dL (ref 100–199)
HDL: 52 mg/dL (ref >39)
LDL Chol Calc (NIH): 71 mg/dL (ref 0–99)
LDL/HDL Ratio: 1.4 ratio (ref 0.0–3.6)
Triglycerides: 93 mg/dL (ref 0–149)
VLDL Cholesterol Cal: 17 mg/dL (ref 5–40)

## 2018-11-29 LAB — COMPREHENSIVE METABOLIC PANEL
ALT: 15 IU/L (ref 0–44)
AST: 22 IU/L (ref 0–40)
Albumin/Globulin Ratio: 1.8 (ref 1.2–2.2)
Albumin: 5 g/dL (ref 4.1–5.2)
Alkaline Phosphatase: 29 IU/L — ABNORMAL LOW (ref 39–117)
BUN/Creatinine Ratio: 11 (ref 9–20)
BUN: 14 mg/dL (ref 6–20)
Bilirubin Total: 0.7 mg/dL (ref 0.0–1.2)
CO2: 24 mmol/L (ref 20–29)
Calcium: 10.7 mg/dL — ABNORMAL HIGH (ref 8.7–10.2)
Chloride: 104 mmol/L (ref 96–106)
Creatinine, Ser: 1.3 mg/dL — ABNORMAL HIGH (ref 0.76–1.27)
GFR calc Af Amer: 86 mL/min/{1.73_m2} (ref >59)
GFR calc non Af Amer: 74 mL/min/{1.73_m2} (ref >59)
Globulin, Total: 2.8 g/dL (ref 1.5–4.5)
Glucose: 91 mg/dL (ref 65–99)
Potassium: 5.5 mmol/L — ABNORMAL HIGH (ref 3.5–5.2)
Sodium: 149 mmol/L — ABNORMAL HIGH (ref 134–144)
Total Protein: 7.8 g/dL (ref 6.0–8.5)

## 2018-11-29 LAB — HEMOGLOBIN A1C
Est. average glucose Bld gHb Est-mCnc: 91 mg/dL
Hgb A1c MFr Bld: 4.8 % (ref 4.8–5.6)

## 2018-11-29 LAB — TSH+FREE T4
Free T4: 1.52 ng/dL (ref 0.82–1.77)
TSH: 1.09 u[IU]/mL (ref 0.450–4.500)

## 2018-11-29 LAB — PROLACTIN: Prolactin: 14.1 ng/mL (ref 4.0–15.2)

## 2018-11-29 NOTE — Psych (Signed)
Virtual Visit via Video Note  I connected with Kirk Leon on 11/29/18 at  9:00 AM EDT by a video enabled telemedicine application and verified that I am speaking with the correct person using two identifiers.   I discussed the limitations of evaluation and management by telemedicine and the availability of in person appointments. The patient expressed understanding and agreed to proceed.  I discussed the assessment and treatment plan with the patient. The patient was provided an opportunity to ask questions and all were answered. The patient agreed with the plan and demonstrated an understanding of the instructions.   The patient was advised to call back or seek an in-person evaluation if the symptoms worsen or if the condition fails to improve as anticipated.  Pt was provided 240 minutes of non-face-to-face time during this encounter.   Kirk Glass, LCSW    [name] PHP THERAPIST PROGRESS NOTE  CORDAI Kirk Leon [id number]  Session Time: 9:00 - 10:00  Participation Level: Active  Behavioral Response: CasualAlertDepressed  Type of Therapy: Group Therapy  Treatment Goals addressed: Coping  Interventions: CBT, DBT, Supportive and Reframing  Summary:  Clinician led check-in regarding current stressors and situation, and review of patient completed daily inventory. Clinician utilized active listening and empathetic response and validated patient emotions. Clinician facilitated processing group on pertinent issues.   Therapist Response: Kirk Leon is a 28 y.o. male who presents with depression and anxiety symptoms. Patient arrived within time allowed and reports that he is feeling "okay." Patient rates his mood at a 5 on a scale of 1-10 with 10 being great. Pt reports he is dealing with a headache this morning and his mood would be ranked higher if that was not happening. Pt states his rumination was improved last night and he had a "semi-productive" day which are contributing to  his mood. 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 Pt states he felt positive about yesterday. Pt continues to struggle with perspective. Pt able to process.  Patient engaged in discussion.      Session Time: 10:00 -11:00  Participation Level: Active  Behavioral Response: CasualAlertDepressed  Type of Therapy: Group Therapy, psychoeducation, psychotherapy  Treatment Goals addressed: Coping  Interventions: CBT, DBT, Solution Focused, Supportive and Reframing  Summary: Cln led discussion on comparisons and how they affect [place]. Group members discussed the ways in which comparisons hold them back and create barriers. Cln shared thought challenging, exposure, and making adaptations as ways to manage comparisons.   Therapist Response: Pt reports comparisons are in issue for him when he feels he is not "where I should be." Pt reports seeing people engaged, married, or successful in their careers makes him feel behind at times. Pt reports social media aids this, however he does not see it as problematic specifically. Pt reports willingness to adapt to reduce exposure to things that increase comparisons.        Session Time: 11:00- 12:00  Participation Level: Active  Behavioral Response: CasualAlertDepressed  Type of Therapy: Group Therapy, Psychoeducation; Psychotherapy  Treatment Goals addressed: Coping  Interventions: CBT; Solution focused; Supportive; Reframing  Summary: Cln introduced topic of jobs and how to healthily view work. Group viewed TED talk "Why some of [place] don't have one true calling." Group members discussed ways they did or did not relate to the talk. Cln highlighted  the concept of "multipotentialite" as a way that changing perspective caused things to align in a drastic way.    Therapist Response:  Patient engaged in activity. Pt reports he related to "all of it" and feels he can be classified as a "multipotentialite" and views the social tension discussed in the talk plays out in the relationship between pt and his parents. Pt shares his parents have a one path mindset for his life, but he has never felt comfortable in that and feels he does have increased lack of direction because he does not fit that social mold. Pt identifies ideal job characteristics as being varied in task and schedule, not being confined to an office, and having independent and team aspects.       Session Time: 12:00 -1:00  Participation Level:Active  Behavioral Response:CasualAlertDepressed  Type of Therapy: Group Therapy, OT  Treatment Goals addressed: Coping  Interventions:Psychosocial skills training, Supportive,   Summary:12:00 - 12:50:Occupational Therapy group 12:50 -1:00 Clinician led check-out. Clinician assessed for immediate needs, medication compliance and efficacy, and safety concerns   Therapist Response: 12:00 - 12:50: Patient engaged in group. See OT note.  12:50 - 1:00: At check-out, patient rates his mood at a 6.5 on a scale of 1-10 with 10 being great. Pt states afternoon plans of doing video editing project and class, taking a walk, and watching a show. Patient demonstrates some progress as evidenced by mood improvement. Patient denies SI/HI/self-harm at the end of group.     Suicidal/Homicidal: Nowithout intent/plan  Plan: Pt will continue in PHP while working to decrease depression and anxiety symptoms, increase ability to manage symptoms in a healthy manner when they arise, and decrease passive SI.   Diagnosis: Severe episode of recurrent major depressive disorder, without psychotic features (HCC) [F33.2]    1. Severe episode of recurrent major depressive disorder, without psychotic features (HCC)   2. GAD (generalized anxiety disorder)       Kirk Guiles, LCSW 11/29/2018

## 2018-11-30 ENCOUNTER — Other Ambulatory Visit: Payer: Self-pay

## 2018-11-30 ENCOUNTER — Other Ambulatory Visit (HOSPITAL_COMMUNITY): Payer: BLUE CROSS/BLUE SHIELD | Admitting: Licensed Clinical Social Worker

## 2018-11-30 DIAGNOSIS — F411 Generalized anxiety disorder: Secondary | ICD-10-CM

## 2018-11-30 DIAGNOSIS — F332 Major depressive disorder, recurrent severe without psychotic features: Secondary | ICD-10-CM

## 2018-11-30 NOTE — Psych (Signed)
Virtual Visit via Video Note  I connected with Kirk Leon on 11/30/18 at  9:00 AM EDT by a video enabled telemedicine application and verified that I am speaking with the correct person using two identifiers.   I discussed the limitations of evaluation and management by telemedicine and the availability of in person appointments. The patient expressed understanding and agreed to proceed.  I discussed the assessment and treatment plan with the patient. The patient was provided an opportunity to ask questions and all were answered. The patient agreed with the plan and demonstrated an understanding of the instructions.   The patient was advised to call back or seek an in-person evaluation if the symptoms worsen or if the condition fails to improve as anticipated.  Pt was provided 240 minutes of non-face-to-face time during this encounter.   [name], LCSW    [facility] BH PHP THERAPIST PROGRESS NOTE  Kirk Leon [id number]  Session Time: 9:00 - 10:00  Participation Level: Active  Behavioral Response: CasualAlertDepressed  Type of Therapy: Group Therapy  Treatment Goals addressed: Coping  Interventions: CBT, DBT, Supportive and Reframing  Summary:  Clinician led check-in regarding current stressors and situation, and review of patient completed daily inventory. Clinician utilized active listening and empathetic response and validated patient emotions. Clinician facilitated processing group on pertinent issues.   Therapist Response: Kirk Leon is a 28 y.o. male who presents with depression and anxiety symptoms. Patient arrived within time allowed and reports that he is feeling "pretty middle of the road." Patient rates his mood at a 5.5 on a scale of 1-10 with 10 being great. Pt reports he did not sleep well last night due to waking up frequently and he is tired which lowered his rating. 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 Pt shares a friend video chatted with him and that went well. Pt shares struggle with feeling isolated. Pt able to process. Patient engaged in discussion.      Session Time: 10:00-11:00  Participation Level:Active  Behavioral Response:CasualAlertDepressed  Type of Therapy: Group Therapy, psychoeducation, psychotherapy  Treatment Goals addressed: Coping  Interventions:CBT, DBT, Solution Focused, Supportive and Reframing  Summary:Cln led discussion on relationships and accountability. Group members shared issues that they have or are experiencing in relationships and what the barriers they are experiencing. Cln brought in topic of boundaries, cognitive distortions, and self-esteem as options for reframing barriers.   Therapist Response: Pt states he continues to struggle with loneliness due to the absence of his ex-girlfriend. Pt states he has been reaching out to people more since discussing loneliness in group last week and is having more frequent interaction, but it does not meet the same need his ex did. Pt is able to identify being needed, being wanted, frequent on-going communication, and deep conversation as the needs that relationship filled. Pt dismisses all of his current relationships as possible to meet those needs.  At encouragement, pt discussed what was on his mind on a deeper level - things he may have shared with ex. Pt processed the relationship and different issues within it. Pt presents a pattern of taking responsibility for the wrong aspects of the relationship and states being angry with his ex and feeling she did things wrong as well, however states these things with flat affect and monotone speech. Pt seeks to make himself at fault for what happened and is able to identify some reasons for why,  however does not believe those reasons yet. Pt able to process.       Session Time: 11:00 -12:00  Participation Level: Active  Behavioral  Response: CasualAlertDepressed  Type of Therapy: Group Therapy, psychotherapy  Treatment Goals addressed: Coping  Interventions: Strengths based, reframing, Supportive,   Summary:  Spiritual Care group  Therapist Response: Patient engaged in group. See chaplain note.        Session Time: 12:00- 1:00  Participation Level: Active  Behavioral Response: CasualAlertDepressed  Type of Therapy: Group Therapy, Psychoeducation  Treatment Goals addressed: Coping  Interventions: relaxation training; Supportive; Reframing  Summary: 12:00 - 12:50: Relaxation group: Cln led group focused on retraining the body's response to stress.   12:50 -1:00 Clinician led check-out. Clinician assessed for immediate needs, medication compliance and efficacy, and safety concerns   Therapist Response: Patient engaged in activity and discussion.  At [place], patient rates his mood at a 6.5 on a scale of 1-10 with 10 being great. Pt states afternoon plans of taking a walk and working on a Conservator, museum/gallery. Patient demonstrates some progress as evidenced by increased openness. Patient denies SI/HI/self-harm at the end of group.    Suicidal/Homicidal: Nowithout intent/plan  Plan: Pt will continue in PHP while working to decrease depression and anxiety symptoms, increase ability to manage symptoms in a healthy manner when they arise, and decrease passive SI.   Diagnosis: Severe episode of recurrent major depressive disorder, without psychotic features ([place]) [F33.2]    1. Severe episode of recurrent major depressive disorder, without psychotic features ([place])   2. GAD (generalized anxiety disorder)       Kirk Glass, LCSW 11/30/2018

## 2018-12-01 ENCOUNTER — Encounter (HOSPITAL_COMMUNITY): Payer: Self-pay

## 2018-12-01 ENCOUNTER — Encounter (HOSPITAL_COMMUNITY): Payer: Self-pay | Admitting: Occupational Therapy

## 2018-12-01 ENCOUNTER — Other Ambulatory Visit: Payer: Self-pay

## 2018-12-01 ENCOUNTER — Other Ambulatory Visit (HOSPITAL_COMMUNITY): Payer: BLUE CROSS/BLUE SHIELD | Admitting: Occupational Therapy

## 2018-12-01 ENCOUNTER — Other Ambulatory Visit (HOSPITAL_COMMUNITY): Payer: BLUE CROSS/BLUE SHIELD | Admitting: Licensed Clinical Social Worker

## 2018-12-01 DIAGNOSIS — F332 Major depressive disorder, recurrent severe without psychotic features: Secondary | ICD-10-CM

## 2018-12-01 DIAGNOSIS — R4589 Other symptoms and signs involving emotional state: Secondary | ICD-10-CM

## 2018-12-01 DIAGNOSIS — F411 Generalized anxiety disorder: Secondary | ICD-10-CM

## 2018-12-01 NOTE — Therapy (Signed)
[facility] [place], [place], [address] Phone: [phone]   Fax:  [phone]  Occupational Therapy Treatment  Patient Details  Name: Kirk Leon MRN: [id number] Date of Birth: 04-08-1990 Referring Provider (OT): [name], NP  Virtual Visit via Video Note  I connected with Kirk Leon on 12/01/18 at  8:00 AM EDT by a video enabled telemedicine application and verified that I am speaking with the correct person using two identifiers.   I discussed the limitations of evaluation and management by telemedicine and the availability of in person appointments. The patient expressed understanding and agreed to proceed.   I discussed the assessment and treatment plan with the patient. The patient was provided an opportunity to ask questions and all were answered. The patient agreed with the plan and demonstrated an understanding of the instructions.   The patient was advised to call back or seek an in-person evaluation if the symptoms worsen or if the condition fails to improve as anticipated.  I provided 60 minutes of non-face-to-face time during this encounter.   [name], OT    Encounter Date: 11/29/2018  OT End of Session - 12/01/18 0842    Visit Number  6    Number of Visits  12    Date for OT Re-Evaluation  12/16/18    Authorization Type  [employer]    OT Start Time  1100    OT Stop Time  1200    OT Time Calculation (min)  60 min    Activity Tolerance  Patient tolerated treatment well    Behavior During Therapy  WFL for tasks assessed/performed       Past Medical History:  Diagnosis Date  . Asthma   . Seasonal allergies     Past Surgical History:  Procedure Laterality Date  . APPENDECTOMY  2012    There were no vitals filed for this visit.  Subjective Assessment - 12/01/18 0842    Currently in Pain?  No/denies           S: I do not sleep enough  O: Education given on sleep  hygiene and its implications with productive daily BADL routines. Pt learned sleep hygiene education via handout, asked to share current practices if applicable. At end of session pt to choose sleep hygiene strategy to begin implementing after education learned today. Discussion facilitated within group on preferred skills.  A: Pt presents with blunted affect, engaged and participatory. He shares how he normally does not sleep enough and has broken sleep. After education, he shares that he would like to implement "get up and try again" and enforcing a consistent bedtime ritual/wind down routine.  P: OT group will be x3 per week while pt in PHP              OT Education - 12/01/18 0842    Education Details  education given on sleep hygiene    Person(s) Educated  Patient    Methods  Explanation;Handout    Comprehension  Verbalized understanding       OT Short Term Goals - 11/18/18 1147      OT SHORT TERM GOAL #1   Title  Pt will be educated on strategies to improve psychosocial skills needed to participate fully in all daily, work, and leisure activities    Time  4    Period  Weeks    Status  On-going    Target Date  12/16/18  OT SHORT TERM GOAL #2   Title  Pt will apply psychosocial skills and coping mechanisms to daily activities in order to function independently and reintegrate into community    Time  4    Period  Weeks    Status  On-going      OT SHORT TERM GOAL #3   Title  Pt will recall and/or apply 1-3 sleep hygiene strategies to improve function in BADL routine upon reintegrating into community    Time  4    Period  Weeks    Status  On-going      OT SHORT TERM GOAL #4   Title  Pt will recall 3-5 RTW and school skills needed for successful reintegration into community    Time  4    Period  Weeks    Status  On-going      OT SHORT TERM GOAL #5   Title  Pt will engage in goal setting to improve functional BADL/IADL routine upon reintegrating into  community.    Time  4    Period  Weeks    Status  On-going               Plan - 12/01/18 9470    Occupational performance deficits (Please refer to evaluation for details):  ADL's;IADL's;Rest and Sleep;Education;Work;Leisure;Social Participation    Body Structure / Function / Physical Skills  ADL;IADL    Cognitive Skills  Energy/Drive;Emotional    Psychosocial Skills  Coping Strategies;Interpersonal Interaction;Routines and Behaviors       Patient will benefit from skilled therapeutic intervention in order to improve the following deficits and impairments:   Body Structure / Function / Physical Skills: ADL, IADL Cognitive Skills: Energy/Drive, Emotional Psychosocial Skills: Coping Strategies, Interpersonal Interaction, Routines and Behaviors   Visit Diagnosis: Severe episode of recurrent major depressive disorder, without psychotic features (HCC)  GAD (generalized anxiety disorder)  Difficulty coping    Problem List Patient Active Problem List   Diagnosis Date Noted  . Suicidal ideation 11/10/2018   Dalphine Handing, MSOT, OTR/L Behavioral Health OT/ Acute Relief OT PHP Office: [phone]  Dalphine Handing 12/01/2018, 8:43 AM  [facility] HOSPITALIZATION PROGRAM [address] [place], [place], [address] Phone: [phone]   Fax:  [phone]  Name: Kirk Leon MRN: [id number] Date of Birth: October 31, 1990

## 2018-12-01 NOTE — Psych (Signed)
Virtual Visit via Video Note  I connected with Kirk Leon on 12/01/18 at  9:00 AM EDT by a video enabled telemedicine application and verified that I am speaking with the correct person using two identifiers.   I discussed the limitations of evaluation and management by telemedicine and the availability of in person appointments. The patient expressed understanding and agreed to proceed.  I discussed the assessment and treatment plan with the patient. The patient was provided an opportunity to ask questions and all were answered. The patient agreed with the plan and demonstrated an understanding of the instructions.   The patient was advised to call back or seek an in-person evaluation if the symptoms worsen or if the condition fails to improve as anticipated.  Pt was provided 240 minutes of non-face-to-face time during this encounter.   [name], LCSW    [facility] BH PHP THERAPIST PROGRESS NOTE  Kirk Leon [id number]  Session Time: 9:00 - 10:00  Participation Level: Active  Behavioral Response: CasualAlertDepressed  Type of Therapy: Group Therapy  Treatment Goals addressed: Coping  Interventions: CBT, DBT, Supportive and Reframing  Summary:  Clinician led check-in regarding current stressors and situation, and review of patient completed daily inventory. Clinician utilized active listening and empathetic response and validated patient emotions. Clinician facilitated processing group on pertinent issues.   Therapist Response: Kirk Leon is a 28 y.o. male who presents with depression and anxiety symptoms. Patient arrived within time allowed and reports that he is feeling "mentally alright." Patient rates his mood at a 6 on a scale of 1-10 with 10 being great. Pt reports he thinks the Seroquel he began this week is hindering his sleep. Pt states it is helping with his rumination, but he continues to have struggles falling asleep. Pt reports getting approximately 3 hours  of sleep last night. 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 Pt reports he is benefiting from the loose routine he ha established and is enjoying having the video editing class to engage with. Pt reports continued struggles with future and past oriented thinking. Pt able to process.  Patient engaged in discussion.      Session Time: 10:00 -11:00  Participation Level: Active  Behavioral Response: CasualAlertDepressed  Type of Therapy: Group Therapy, psychoeducation, psychotherapy  Treatment Goals addressed: Coping  Interventions: CBT, DBT, Solution Focused, Supportive and Reframing  Summary: Clinician continued topic of cognitive distortions. Cln introduced thought challenging and how to practice it by bringing logic and alternate perspectives into the scenario. Group members worked to challenge unhealthy thought pattern examples.   Therapist Response: Pt participates in challenging personal example of he will never figure out what to do with his life. Pt identifies black and white thinking and emotional reasoning within his example. Pt is able to reframe and recognizes biases he has about different paths.        Session Time: 11:00- 12:00  Participation Level: Active  Behavioral Response: CasualAlertDepressed  Type of Therapy: Group Therapy, Psychoeducation; Psychotherapy  Treatment Goals addressed: Coping  Interventions: CBT; Solution focused; Supportive; Reframing  Summary: Clinician introduced topic of fear setting. Group viewed TED talk "Why you should define your fears instead of your goals."  Group utilized fear setting model to work through a current fear and analyze it.  Therapist Response: Patient engaged in activity and discussion. Pt demonstrates understanding of fear setting model as  evidenced by participating in group example. Pt reports  he believes he could use this method and it would be helpful.       Session Time: 12:00 -1:00  Participation Level:Active  Behavioral Response:CasualAlertDepressed  Type of Therapy: Group Therapy, OT  Treatment Goals addressed: Coping  Interventions:Psychosocial skills training, Supportive,   Summary:12:00 - 12:50:Occupational Therapy group 12:50 -1:00 Clinician led check-out. Clinician assessed for immediate needs, medication compliance and efficacy, and safety concerns   Therapist Response: 12:00 - 12:50: Patient engaged in group. See OT note.  12:50 - 1:00: At check-out, patient rates his mood at a 6.5 on a scale of 1-10 with 10 being great. 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 Patient denies SI/HI/self-harm at the end of group.     Suicidal/Homicidal: Nowithout intent/plan  Plan: Pt will continue in PHP while working to decrease depression and anxiety symptoms, increase ability to manage symptoms in a healthy manner when they arise, and decrease passive SI.   Diagnosis: Severe episode of recurrent major depressive disorder, without psychotic features ([place]) [F33.2]    1. Severe episode of recurrent major depressive disorder, without psychotic features ([place])   2. GAD (generalized anxiety disorder)       [name], LCSW 12/01/2018

## 2018-12-01 NOTE — Therapy (Signed)
[place], [place], [address] Phone: [phone]   Fax:  [phone]  Occupational Therapy Treatment  Patient Details  Name: VOSHON PETRO MRN: [id number] Date of Birth: [birth year] Referring Provider (OT): [name], NP  Virtual Visit via Video Note  I connected with [name] on 12/01/18 at  8:00 AM EDT by a video enabled telemedicine application and verified that I am speaking with the correct person using two identifiers.   I discussed the limitations of evaluation and management by telemedicine and the availability of in person appointments. The patient expressed understanding and agreed to proceed.   I discussed the assessment and treatment plan with the patient. The patient was provided an opportunity to ask questions and all were answered. The patient agreed with the plan and demonstrated an understanding of the instructions.   The patient was advised to call back or seek an in-person evaluation if the symptoms worsen or if the condition fails to improve as anticipated.  I provided 60 minutes of non-face-to-face time during this encounter.   [name], OT    Encounter Date: 12/01/2018  OT End of Session - 12/01/18 1650    Visit Number  7    Number of Visits  12    Date for OT Re-Evaluation  12/16/18    Authorization Type  [employer]    OT Start Time  1100    OT Stop Time  1200    OT Time Calculation (min)  60 min    Activity Tolerance  Patient tolerated treatment well    Behavior During Therapy  WFL for tasks assessed/performed       Past Medical History:  Diagnosis Date  . Asthma   . Seasonal allergies     Past Surgical History:  Procedure Laterality Date  . APPENDECTOMY  2012    There were no vitals filed for this visit.  Subjective Assessment - 12/01/18 1649    Currently in Pain?  No/denies       S: I want to improve my self esteem   O: Education given on  protective factors and their importance in building resiliency to face difficult life challenges. Protective factors worksheet completed. Pt to rate current protective factors of social support, coping skills, physical health, sense of purpose, self-esteem, and healthy thinking on a scale from weak-moderate-strong. Pt then to identify the most valuable protective factor, 2 protective factors to improve, and specific goals to accomplish this task.   A: Pt presents with camera off given connection issues, but engaged and participatory throughout session. Pt shares how his social support is strong. He wants to improve his healthy thinking and self esteem. He mentions frequently ruminating on self depricating thoughts. He wants to improve this through implementing coping skills he has learned in PHP thus far.  P: OT group will be x3 per week while pt in PHP                OT Education - 12/01/18 1649    Education Details  education given on protective factors    Person(s) Educated  Patient    Methods  Explanation;Handout    Comprehension  Verbalized understanding       OT Short Term Goals - 11/18/18 1147      OT SHORT TERM GOAL #1   Title  Pt will be educated on strategies to improve psychosocial skills needed to participate fully in all daily, work, and  leisure activities    Time  4    Period  Weeks    Status  On-going    Target Date  12/16/18      OT SHORT TERM GOAL #2   Title  Pt will apply psychosocial skills and coping mechanisms to daily activities in order to function independently and reintegrate into community    Time  4    Period  Weeks    Status  On-going      OT SHORT TERM GOAL #3   Title  Pt will recall and/or apply 1-3 sleep hygiene strategies to improve function in BADL routine upon reintegrating into community    Time  4    Period  Weeks    Status  On-going      OT SHORT TERM GOAL #4   Title  Pt will recall 3-5 RTW and school skills needed for successful  reintegration into community    Time  4    Period  Weeks    Status  On-going      OT SHORT TERM GOAL #5   Title  Pt will engage in goal setting to improve functional BADL/IADL routine upon reintegrating into community.    Time  4    Period  Weeks    Status  On-going               Plan - 12/01/18 1650    Occupational performance deficits (Please refer to evaluation for details):  ADL's;IADL's;Rest and Sleep;Education;Work;Leisure;Social Participation    Body Structure / Function / Physical Skills  ADL;IADL    Cognitive Skills  Energy/Drive;Emotional    Psychosocial Skills  Coping Strategies;Interpersonal Interaction;Routines and Behaviors       Patient will benefit from skilled therapeutic intervention in order to improve the following deficits and impairments:   Body Structure / Function / Physical Skills: ADL, IADL Cognitive Skills: Energy/Drive, Emotional Psychosocial Skills: Coping Strategies, Interpersonal Interaction, Routines and Behaviors   Visit Diagnosis: Severe episode of recurrent major depressive disorder, without psychotic features (HCC)  GAD (generalized anxiety disorder)  Difficulty coping    Problem List Patient Active Problem List   Diagnosis Date Noted  . Suicidal ideation 11/10/2018   Dalphine Handing, MSOT, OTR/L Behavioral Health OT/ Acute Relief OT PHP Office: [phone]  Dalphine Handing 12/01/2018, 4:51 PM  [facility] PARTIAL HOSPITALIZATION PROGRAM [address] [place], [place], [address] Phone: [phone]   Fax:  [phone]  Name: PAYMON ROSENSTEEL MRN: [id number] Date of Birth: Jan 22, 1991

## 2018-12-02 ENCOUNTER — Other Ambulatory Visit: Payer: Self-pay

## 2018-12-02 ENCOUNTER — Other Ambulatory Visit (HOSPITAL_COMMUNITY): Payer: BLUE CROSS/BLUE SHIELD | Admitting: Occupational Therapy

## 2018-12-02 ENCOUNTER — Other Ambulatory Visit (HOSPITAL_COMMUNITY): Payer: BLUE CROSS/BLUE SHIELD | Admitting: Licensed Clinical Social Worker

## 2018-12-02 DIAGNOSIS — F411 Generalized anxiety disorder: Secondary | ICD-10-CM

## 2018-12-02 DIAGNOSIS — F332 Major depressive disorder, recurrent severe without psychotic features: Secondary | ICD-10-CM

## 2018-12-02 DIAGNOSIS — R4589 Other symptoms and signs involving emotional state: Secondary | ICD-10-CM

## 2018-12-02 MED ORDER — QUETIAPINE FUMARATE 50 MG PO TABS: 50.0000 mg | ORAL_TABLET | Freq: Every day | ORAL | 0 refills | Status: DC

## 2018-12-02 NOTE — Progress Notes (Signed)
Spoke with patient via Webex video call,used 2 identifiers to correctly identify patient. He is feeling depressed and down today because he just found out his ex-girlfriend was cast on the bachelor. They just broke up a month ago and she is already moving on. He would like to be able to get out and meet people but living here with his parents he is unable due to [place]. His mother is immunocompromised and he can't get out like he wants. He dropped out of law school and plans to go back to [place] when his parents think it's safe for him to return. He denies SI/HI or AV hallucinations. On scale of 1-10 as 10 being worst he rates depression at 7 and anxiety at 4. He is still having trouble sleeping but his Seroquel was increased to 50mg  and he will try it tonight. No longer taking Trazodone or Ambien. No plan of discharge that he is aware of. No other issues or complaints.

## 2018-12-02 NOTE — Progress Notes (Signed)
Virtual Visit via Video Note  I connected with Kirk Leon on 12/02/18 at  9:00 AM EDT by a video enabled telemedicine application and verified that I am speaking with the correct person using two identifiers.   I discussed the limitations of evaluation and management by telemedicine and the availability of in person appointments. The patient expressed understanding and agreed to proceed.   I discussed the assessment and treatment plan with the patient. The patient was provided an opportunity to ask questions and all were answered. The patient agreed with the plan and demonstrated an understanding of the instructions.   The patient was advised to call back or seek an in-person evaluation if the symptoms worsen or if the condition fails to improve as anticipated.  I provided 00 minutes of non-face-to-face time during this encounter.   [name], NP   BH MD/PA/NP OP Progress Note  12/02/2018 9:57 AM Kirk Leon  MRN:  [id number]   Evaluation: Kirk Leon was seen via web-ex, continue to reported depression, poor concentration and rumination.  discussed titration with Seroquel 25 mg to 50 mg Q HS.  Patient continue to have thoughts of being "not enough" due to a recent break up.  States his depression is worsened today due to finding out that his girlfriend is not interested.  He denies suicidal or homicidal ideations.  Denies auditory or visual hallucinations.  Reports that Seroquel has helped slightly with his ruminations.  Discussed plan to continue to titrate medication as appropriate.  Patient to continue partial hospitalization programming.   Visit Diagnosis: No diagnosis found.  Past Psychiatric History:   Past Medical History:  Past Medical History:  Diagnosis Date  . Asthma   . Seasonal allergies     Past Surgical History:  Procedure Laterality Date  . APPENDECTOMY  2012    Family Psychiatric History:   Family History:  Family History  Problem Relation Age of  Onset  . Depression Maternal Aunt     Social History:  Social History   Socioeconomic History  . Marital status: Single    Spouse name: Not on file  . Number of children: Not on file  . Years of education: Not on file  . Highest education level: Not on file  Occupational History  . Not on file  Social Needs  . Financial resource strain: Not hard at all  . Food insecurity    Worry: Never true    Inability: Never true  . Transportation needs    Medical: No    Non-medical: No  Tobacco Use  . Smoking status: Never Smoker  . Smokeless tobacco: Never Used  Substance and Sexual Activity  . Alcohol use: Yes    Comment: 4-5 mixed drinks 1-2 weekly  . Drug use: Yes    Types: Marijuana    Comment: uses on occasion  . Sexual activity: Not on file  Lifestyle  . Physical activity    Days per week: 7 days    Minutes per session: 30 min  . Stress: Very much  Relationships  . Social connections    Talks on phone: More than three times a week    Gets together: More than three times a week    Attends religious service: More than 4 times per year    Active member of club or organization: Yes    Attends meetings of clubs or organizations: More than 4 times per year    Relationship status: Never married  Other Topics Concern  .  Not on file  Social History Narrative  . Not on file    Allergies:  Allergies  Allergen Reactions  . Sulfa Antibiotics Hives    Metabolic Disorder Labs: Lab Results  Component Value Date   HGBA1C 4.8 11/28/2018   Lab Results  Component Value Date   PROLACTIN 14.1 11/28/2018   Lab Results  Component Value Date   CHOL 140 11/28/2018   TRIG 93 11/28/2018   HDL 52 11/28/2018   Lab Results  Component Value Date   TSH 1.090 11/28/2018    Therapeutic Level Labs: No results found for: LITHIUM No results found for: VALPROATE No components found for:  CBMZ  Current Medications: Current Outpatient Medications  Medication Sig Dispense Refill   . buPROPion (WELLBUTRIN XL) 300 MG 24 hr tablet Take 1 tablet (300 mg total) by mouth every morning. 30 tablet 2  . hydrOXYzine (ATARAX/VISTARIL) 25 MG tablet Take 1 tablet (25 mg total) by mouth 3 (three) times daily as needed for anxiety. 42 tablet 0  . QUEtiapine (SEROQUEL) 25 MG tablet Take 1 tablet (25 mg total) by mouth at bedtime. 30 tablet 0  . traZODone (DESYREL) 50 MG tablet Take 50 mg by mouth at bedtime.    . traZODone (DESYREL) 50 MG tablet Take 1 tablet (50 mg total) by mouth at bedtime. 30 tablet 0  . zolpidem (AMBIEN CR) 12.5 MG CR tablet Take 1 tablet (12.5 mg total) by mouth at bedtime as needed for sleep. (Patient not taking: Reported on 11/22/2018) 30 tablet 1   No current facility-administered medications for this visit.      Musculoskeletal: Strength & Muscle Tone: within normal limits Gait & Station: normal Patient leans: N/A  Psychiatric Specialty Exam: ROS  There were no vitals taken for this visit.There is no height or weight on file to calculate BMI.  General Appearance: Casual  Eye Contact:  Fair  Speech:  Clear and Coherent  Volume:  Normal  Mood:  Anxious and Depressed  Affect:  Congruent  Thought Process:  Coherent  Orientation:  Full (Time, Place, and Person)  Thought Content: Rumination   Suicidal Thoughts:  No  Homicidal Thoughts:  No  Memory:  Immediate;   Fair  Judgement:  Fair  Insight:  Fair  Psychomotor Activity:  Normal  Concentration:  Concentration: Fair  Recall:  [employer] of Knowledge: Fair  Language: Fair  Akathisia:  No  Handed:  Right  AIMS (if indicated):   Assets:  Communication Skills Desire for Improvement Resilience Social Support  ADL's:  Intact  Cognition: WNL  Sleep:  Fair   Screenings: AIMS     [facility] (Discharged[facility]  AIMS Total Score  0    AUDIT     Admission (Discharged) [facility]  Alcohol Use Disorder Identification Test Final Score (AUDIT)  2    PHQ2-9     Counselor from 11/18/2018 in [facility]  PHQ-2 Total Score  6  PHQ-9 Total Score  22       Assessment and Plan:  Continue Partial Hospitalization programming   Increased Seroquel  25 mg to 50 mg QHS-we will continue to titrate where appropriate  Treatment plan was reviewed and agreed upon by NP T. Arihaan Bellucci inpatient Merrell Edick's need for continued group services  Oneta Rack, NP 12/02/2018, 9:57 AM

## 2018-12-03 ENCOUNTER — Encounter (HOSPITAL_COMMUNITY): Payer: Self-pay | Admitting: Family

## 2018-12-05 ENCOUNTER — Other Ambulatory Visit: Payer: Self-pay

## 2018-12-05 ENCOUNTER — Other Ambulatory Visit (HOSPITAL_COMMUNITY): Payer: BLUE CROSS/BLUE SHIELD | Admitting: Licensed Clinical Social Worker

## 2018-12-05 DIAGNOSIS — F332 Major depressive disorder, recurrent severe without psychotic features: Secondary | ICD-10-CM

## 2018-12-05 DIAGNOSIS — F411 Generalized anxiety disorder: Secondary | ICD-10-CM

## 2018-12-06 ENCOUNTER — Encounter (HOSPITAL_COMMUNITY): Payer: Self-pay | Admitting: Occupational Therapy

## 2018-12-06 ENCOUNTER — Encounter (HOSPITAL_COMMUNITY): Payer: Self-pay | Admitting: Family

## 2018-12-06 ENCOUNTER — Other Ambulatory Visit (HOSPITAL_COMMUNITY): Payer: BLUE CROSS/BLUE SHIELD | Admitting: Licensed Clinical Social Worker

## 2018-12-06 ENCOUNTER — Other Ambulatory Visit (HOSPITAL_COMMUNITY): Payer: BLUE CROSS/BLUE SHIELD | Admitting: Occupational Therapy

## 2018-12-06 ENCOUNTER — Telehealth (HOSPITAL_COMMUNITY): Payer: Self-pay | Admitting: Psychiatry

## 2018-12-06 ENCOUNTER — Other Ambulatory Visit: Payer: Self-pay

## 2018-12-06 DIAGNOSIS — R4589 Other symptoms and signs involving emotional state: Secondary | ICD-10-CM

## 2018-12-06 DIAGNOSIS — F332 Major depressive disorder, recurrent severe without psychotic features: Secondary | ICD-10-CM

## 2018-12-06 DIAGNOSIS — F411 Generalized anxiety disorder: Secondary | ICD-10-CM

## 2018-12-06 NOTE — Psych (Signed)
Virtual Visit via Video Note  I connected with [name] on 12/02/18 at  9:00 AM EDT by a video enabled telemedicine application and verified that I am speaking with the correct person using two identifiers.   I discussed the limitations of evaluation and management by telemedicine and the availability of in person appointments. The patient expressed understanding and agreed to proceed.  I discussed the assessment and treatment plan with the patient. The patient was provided an opportunity to ask questions and all were answered. The patient agreed with the plan and demonstrated an understanding of the instructions.   The patient was advised to call back or seek an in-person evaluation if the symptoms worsen or if the condition fails to improve as anticipated.  Pt was provided 240 minutes of non-face-to-face time during this encounter.   [name], LCSW    [facility] PHP THERAPIST PROGRESS NOTE  Kirk Leon [id number]  Session Time: 9:00 - 10:00  Participation Level: Active  Behavioral Response: CasualAlertDepressed  Type of Therapy: Group Therapy  Treatment Goals addressed: Coping  Interventions: CBT, DBT, Supportive and Reframing  Summary:  Clinician led check-in regarding current stressors and situation, and review of patient completed daily inventory. Clinician utilized active listening and empathetic response and validated patient emotions. Clinician facilitated processing group on pertinent issues.   Therapist Response: Kirk Leon is a 28 y.o. male who presents with depression and anxiety symptoms. Patient arrived within time allowed and reports that he is feeling "not good." Patient rates his mood at a 2 on a scale of 1-10 with 10 being great. Pt reports he was "feeling pretty good" until this morning when he saw that his ex-girlfriend was making her social media accounts private. Pt states he views this as evidence she has been cast in The Bachelor, and is "99%  sure" that's what these actions mean. Pt is very upset about the idea that she may be on the reality show and expresses concerns for her and for himself. Pt states yesterday he talked to a friend, did more video editing, had an appointment, and watched basketball. Pt reports continued struggles with moving past his last relationship. Pt able to process.  Patient engaged in discussion.      Session Time: 10:00 -11:00  Participation Level: Active  Behavioral Response: CasualAlertDepressed  Type of Therapy: Group Therapy, psychoeducation, psychotherapy  Treatment Goals addressed: Coping  Interventions: CBT, DBT, Solution Focused, Supportive and Reframing  Summary: Clinician led discussion on how to determine the deeper issue behind a feeling. Cln introduced the concept of "place holder" feelings/events and how to work it back to determine the bigger feeling/issue at hand. Group members worked with examples to practice the concept and shared ways in which they could gain insight by using this practice.   Therapist Response: Pt participates in discussion. Pt is able to work back his fixation on his ex-girlfriend to a desire to be distracted or "consumed" by her life and ups/downs instead of dealing with his own stuff. Pt demonstrates insight by recognizing this deeper issue, however argues with himself, going back and forth in discussion, whether that is really true or not.        Session Time: 11:00- 12:00  Participation Level: Active  Behavioral Response: CasualAlertDepressed  Type of Therapy: Group Therapy, Psychoeducation; Psychotherapy  Treatment Goals addressed: Coping  Interventions: CBT; Solution focused; Supportive; Reframing  Summary: Clinician introduced topic of needs. Cln provided education on [place] of Needs and discussed  how to apply it in the way we think about what necessitates a need and what order is logical for tackling issues.    Therapist Response: Patient engaged in discussion. Pt reports he labels many things needs that are actually wants and that he tends to focus on higher levels before the lower is taken care of. Pt struggles to see how mental health treatment is necessary in terms of safety before he can focus on relationships.        Session Time: 12:00 -1:00  Participation Level:Active  Behavioral Response:CasualAlertDepressed  Type of Therapy: Group Therapy, OT  Treatment Goals addressed: Coping  Interventions:Psychosocial skills training, Supportive,   Summary:12:00 - 12:50:Occupational Therapy group 12:50 -1:00 Clinician led check-out. Clinician assessed for immediate needs, medication compliance and efficacy, and safety concerns   Therapist Response: 12:00 - 12:50: Patient engaged in group. See OT note.  12:50 - 1:00: At check-out, patient rates his mood at a 4 on a scale of 1-10 with 10 being great. Pt states afternoon plans of relaxing and working on his video editing class. Patient demonstrates some progress as evidenced by demonstrating insight into his barriers. Patient denies SI/HI/self-harm at the end of group.     Suicidal/Homicidal: Nowithout intent/plan  Plan: Pt will continue in PHP while working to decrease depression and anxiety symptoms, increase ability to manage symptoms in a healthy manner when they arise, and decrease passive SI.   Diagnosis: Severe episode of recurrent major depressive disorder, without psychotic features (HCC) [F33.2]    1. Severe episode of recurrent major depressive disorder, without psychotic features (HCC)   2. GAD (generalized anxiety disorder)       [name], LCSW 12/06/2018

## 2018-12-06 NOTE — Progress Notes (Signed)
Virtual Visit via Video Note  I connected with Kirk Leon on 12/06/18 at  9:00 AM EDT by a video enabled telemedicine application and verified that I am speaking with the correct person using two identifiers.   I discussed the limitations of evaluation and management by telemedicine and the availability of in person appointments. The patient expressed understanding and agreed to proceed.    I discussed the assessment and treatment plan with the patient. The patient was provided an opportunity to ask questions and all were answered. The patient agreed with the plan and demonstrated an understanding of the instructions.   The patient was advised to call back or seek an in-person evaluation if the symptoms worsen or if the condition fails to improve as anticipated.  I provided 00 minutes of non-face-to-face time during this encounter.   [name], NP   [facility] Discharge Summary  Kirk Leon [id number]  Admission date:11/16/2018  Discharge date: 12/06/2018  Reason for admission: Kirk Leon is a 28 y.o. Caucasian male presents with depression and suicidal ideations.  Currently denying intent or plan.  Reports a longstanding history with depression and anxiety.  Reports for the past 10 years he has been struggling with depression.  Reports more recently he has experienced a break-up with his girlfriend of 1-1/2 years.  Reports recent diagnosis with COVID.  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  Patient reports feeling " free as he was traveling on the [place]" reports feelings of hopelessness worthlessness.  Reports intermittent bouts of depression.  [facility] who recently initiated Wellbutrin and trazodone for reported symptoms.  Patient validates information provided in the below assessment.  Patient was enrolled in partial  psychiatric program on 11/16/18.  Chemical Use History:  Denied    Family of Origin Issues: Continues to report his family has been supportive doing this depressive episode.  Stated social isolation is due to positive COVID testing quarantine has been completed.  Progress in Program Toward Treatment Goals: Ongoing patient attended and participated with daily group session with active and engaged participation.  Patient appeared mood dependent on ex-girlfriend.  Continues to endorse worsening depression symptoms.  Denying suicidal or homicidal ideations.  Patient was initiated on Seroquel while attending partial hospitalization program which she reports has been helping his insomnia racing thoughts and overall mood.  Patient transitioning to intensive outpatient programming  Progress (rationale): Stepping down to intensive outpatient programming (IOP)-medications will be refilled.  Labs reviewed no concerns discussed follow-up with primary care related to slight elevation with potassium 5.5.  -Continue Wellbutrin 300 mg p.o. daily Continue Seroquel 50 mg p.o. nightly -Patient has discontinued Ambien 25 mg p.o. nightly   Take all medications as prescribed. Keep all follow-up appointments as scheduled.  Do not consume alcohol or use illegal drugs while on prescription medications. Report any adverse effects from your medications to your primary care provider promptly.  In the event of recurrent symptoms or worsening symptoms, call 911, a crisis hotline, or go to the nearest emergency department for evaluation.    [name], NP 12/06/2018

## 2018-12-06 NOTE — Telephone Encounter (Signed)
D:  Patient will be transitioning to MH-IOP from [facility] tomorrow.  A:  Placed call to orient patient and answer his questions.  Pt will start MH-IOP tomorrow at 9 a.m.  R:  Pt receptive.

## 2018-12-06 NOTE — Therapy (Signed)
[facility] PARTIAL HOSPITALIZATION PROGRAM [address] [place], [place], [address] Phone: [phone]   Fax:  [phone]  Occupational Therapy Treatment  Patient Details  Name: Kirk Leon MRN: [id number] Date of Birth: 1990-11-01 Referring Provider (OT): Hillery Jacks, NP  Virtual Visit via Video Note  I connected with Kirk Leon on 12/06/18 at  8:00 AM EDT by a video enabled telemedicine application and verified that I am speaking with the correct person using two identifiers.   I discussed the limitations of evaluation and management by telemedicine and the availability of in person appointments. The patient expressed understanding and agreed to proceed.  I discussed the assessment and treatment plan with the patient. The patient was provided an opportunity to ask questions and all were answered. The patient agreed with the plan and demonstrated an understanding of the instructions.   The patient was advised to call back or seek an in-person evaluation if the symptoms worsen or if the condition fails to improve as anticipated.  I provided 60 minutes of non-face-to-face time during this encounter.   Dalphine Handing, OT    Encounter Date: 12/02/2018  OT End of Session - 12/06/18 1626    Visit Number  8    Number of Visits  12    Date for OT Re-Evaluation  12/16/18    Authorization Type  [employer]    OT Start Time  1100    OT Stop Time  1200    OT Time Calculation (min)  60 min    Activity Tolerance  Patient tolerated treatment well    Behavior During Therapy  WFL for tasks assessed/performed       Past Medical History:  Diagnosis Date  . Asthma   . Seasonal allergies     Past Surgical History:  Procedure Laterality Date  . APPENDECTOMY  2012    There were no vitals filed for this visit.  Subjective Assessment - 12/06/18 1625    Currently in Pain?  No/denies        S: This applies to my previous relationship   O: Education  given on verbal communication skills this date. Fair fighting rules discussed in reference to a variety of relationships. Pt asked to apply certain rules to a current situation in life. Video clip of a relationship argument shown for pts to apply fair fighting rules and discuss with group."I statements" worksheet given to provide education on appropriate communication styles when in emotional situations. Pt asked to apply personal example of I statement at end of session.   A: Pt presents to group with blunted affect, engaged and participatory. He sharers how fair fighting applies to his previous romantic relationship. Pt sharing examples and implementing them appropriate to OT. Shares ideas to change for future. Pt wants to implement I statements and practice not "stonewalling" for better communication.  P: OT group will be x3 per week while pt in PHP                OT Education - 12/06/18 1625    Education Details  education given on fair fighting    Person(s) Educated  Patient    Methods  Explanation;Handout    Comprehension  Verbalized understanding       OT Short Term Goals - 11/18/18 1147      OT SHORT TERM GOAL #1   Title  Pt will be educated on strategies to improve psychosocial skills needed to participate fully in all daily,  work, and leisure activities    Time  4    Period  Weeks    Status  On-going    Target Date  12/16/18      OT SHORT TERM GOAL #2   Title  Pt will apply psychosocial skills and coping mechanisms to daily activities in order to function independently and reintegrate into community    Time  4    Period  Weeks    Status  On-going      OT SHORT TERM GOAL #3   Title  Pt will recall and/or apply 1-3 sleep hygiene strategies to improve function in BADL routine upon reintegrating into community    Time  4    Period  Weeks    Status  On-going      OT SHORT TERM GOAL #4   Title  Pt will recall 3-5 RTW and school skills needed for successful  reintegration into community    Time  4    Period  Weeks    Status  On-going      OT SHORT TERM GOAL #5   Title  Pt will engage in goal setting to improve functional BADL/IADL routine upon reintegrating into community.    Time  4    Period  Weeks    Status  On-going               Plan - 12/06/18 1626    Occupational performance deficits (Please refer to evaluation for details):  ADL's;IADL's;Rest and Sleep;Education;Work;Leisure;Social Participation    Body Structure / Function / Physical Skills  ADL;IADL    Cognitive Skills  Energy/Drive;Emotional    Psychosocial Skills  Coping Strategies;Interpersonal Interaction;Routines and Behaviors       Patient will benefit from skilled therapeutic intervention in order to improve the following deficits and impairments:   Body Structure / Function / Physical Skills: ADL, IADL Cognitive Skills: Energy/Drive, Emotional Psychosocial Skills: Coping Strategies, Interpersonal Interaction, Routines and Behaviors   Visit Diagnosis: Severe episode of recurrent major depressive disorder, without psychotic features (HCC)  GAD (generalized anxiety disorder)  Difficulty coping    Problem List Patient Active Problem List   Diagnosis Date Noted  . Suicidal ideation 11/10/2018   [name], MSOT, OTR/L Behavioral Health OT/ Acute Relief OT PHP Office: [place] 12/06/2018, 4:27 PM  [facility] PARTIAL HOSPITALIZATION PROGRAM [place], [place], [address] Phone: [phone]   Fax:  [phone]  Name: Kirk Leon MRN: [id number] Date of Birth: 03/11/1990

## 2018-12-06 NOTE — Psych (Signed)
Virtual Visit via Video Note  I connected with Kirk Leon on 12/05/18 at  9:00 AM EDT by a video enabled telemedicine application and verified that I am speaking with the correct person using two identifiers.   I discussed the limitations of evaluation and management by telemedicine and the availability of in person appointments. The patient expressed understanding and agreed to proceed.  I discussed the assessment and treatment plan with the patient. The patient was provided an opportunity to ask questions and all were answered. The patient agreed with the plan and demonstrated an understanding of the instructions.   The patient was advised to call back or seek an in-person evaluation if the symptoms worsen or if the condition fails to improve as anticipated.  Pt was provided 240 minutes of non-face-to-face time during this encounter.   [name], LCSW    [facility] PHP THERAPIST PROGRESS NOTE  Kirk Leon [id number]  Session Time: 9:00 - 10:00  Participation Level: Active  Behavioral Response: CasualAlertDepressed  Type of Therapy: Group Therapy  Treatment Goals addressed: Coping  Interventions: CBT, DBT, Supportive and Reframing  Summary:  Clinician led check-in regarding current stressors and situation, and review of patient completed daily inventory. Clinician utilized active listening and empathetic response and validated patient emotions. Clinician facilitated processing group on pertinent issues.   Therapist Response: Kirk Leon is a 28 y.o. male who presents with depression and anxiety symptoms. Patient arrived within time allowed and reports that he is feeling "not good." Patient rates his mood at a 3.5 on a scale of 1-10 with 10 being great. Pt states "I just want to feel better at this point." Pt reports he did not do much over his weekend and his mood never rebounded from Friday. Pt shares that resentments he has towards his parents is growing and he  ruminated a lot over the weekend about them. Pt states they had a "big fight" and he feels "trapped." Pt reports he does not think he can get better while he is in their home and exhibits increased fixation on getting back to [place]. Pt able to process. Patient engaged in discussion.      Session Time: 10:00 -11:00  Participation Level: Active  Behavioral Response: CasualAlertDepressed  Type of Therapy: Group Therapy, psychoeducation, psychotherapy  Treatment Goals addressed: Coping  Interventions: CBT, DBT, Solution Focused, Supportive and Reframing  Summary: Cln led discussion on how pt's are doing in the current social climate. Pt's shared ways they are affected and or dealing with the pandemic and social justice issues currently happening in our society.   Therapist Response: Patient reports he has become "used to" the pandemic however continues to struggle with the changes it has made in his life. Pt identifies increased social isolation, less freedom of travel, and uncertainty in making future plans. Pt able to process.        Session Time: 11:00- 12:00  Participation Level: Active  Behavioral Response: CasualAlertDepressed  Type of Therapy: Group Therapy, Psychoeducation; Psychotherapy  Treatment Goals addressed: Coping  Interventions: CBT; DBT; Solution focused; Supportive; Reframing  Summary: Clinician continued topic of needs and introduced the Needs Assessment. Cln reviewed pt's answers in the needs assessment and problem solved options to address needs that need to be met.   Therapist Response: Pt participated in discussion and activity. Pt identifies self-esteem, education, and career as areas he needs to work on.       Session Time: 12:00 -1:00  Participation Level: Active  Behavioral Response: CasualAlertDepressed  Type of Therapy: Group Therapy, Psychoeducation; Psychotherapy  Treatment Goals addressed:  Coping  Interventions: CBT; DBT; Solution focused; Supportive; Reframing  Summary:12:00 - 12:50:Clinician introduced topic of radical acceptance. Cln educated on the concept of radical acceptance and times in which it may be helpful.  12:50 -1:00 Clinician led check-out. Clinician assessed for immediate needs, medication compliance and efficacy, and safety concerns   Therapist Response: 12:00 - 12:50: Pt reports understanding of radical acceptance and identifies that acceptance would be helpful in terms of his current situation.  12:50 - 1:00: At check-out, patient rates his mood at a 4 on a scale of 1-10 with 10 being great. Pt states afternoon plans of going outside, working on video editing, and resting. Patient demonstrates some progress as evidenced by acknowleding his feelings. Patient denies SI/HI/self-harm at the end of group.    Suicidal/Homicidal: Nowithout intent/plan  Plan: Pt will continue in PHP while working to decrease depression and anxiety symptoms, increase ability to manage symptoms in a healthy manner when they arise, and decrease passive SI.   Diagnosis: Severe episode of recurrent major depressive disorder, without psychotic features ([place]) [F33.2]    1. Severe episode of recurrent major depressive disorder, without psychotic features ([place])   2. GAD (generalized anxiety disorder)       [name], LCSW 12/06/2018

## 2018-12-07 ENCOUNTER — Encounter (HOSPITAL_COMMUNITY): Payer: Self-pay

## 2018-12-07 ENCOUNTER — Other Ambulatory Visit (HOSPITAL_COMMUNITY): Payer: BLUE CROSS/BLUE SHIELD

## 2018-12-07 ENCOUNTER — Encounter (HOSPITAL_COMMUNITY): Payer: Self-pay | Admitting: Family

## 2018-12-07 ENCOUNTER — Other Ambulatory Visit (HOSPITAL_COMMUNITY): Payer: BLUE CROSS/BLUE SHIELD | Admitting: Psychiatry

## 2018-12-07 ENCOUNTER — Other Ambulatory Visit: Payer: Self-pay

## 2018-12-07 DIAGNOSIS — R4589 Other symptoms and signs involving emotional state: Secondary | ICD-10-CM

## 2018-12-07 DIAGNOSIS — F332 Major depressive disorder, recurrent severe without psychotic features: Secondary | ICD-10-CM

## 2018-12-07 DIAGNOSIS — F411 Generalized anxiety disorder: Secondary | ICD-10-CM

## 2018-12-07 NOTE — Progress Notes (Signed)
Virtual Visit via Video Note  I connected with Kirk Leon on 12/07/18 at  9:00 AM EDT by a video enabled telemedicine application and verified that I am speaking with the correct person using two identifiers.   I discussed the limitations of evaluation and management by telemedicine and the availability of in person appointments. The patient expressed understanding and agreed to proceed.     I discussed the assessment and treatment plan with the patient. The patient was provided an opportunity to ask questions and all were answered. The patient agreed with the plan and demonstrated an understanding of the instructions.   The patient was advised to call back or seek an in-person evaluation if the symptoms worsen or if the condition fails to improve as anticipated.  I provided 00 minutes of non-face-to-face time during this encounter.   Oneta Rack, NP    Psychiatric Initial Adult Assessment   Patient Identification: Kirk Leon MRN:  [id number] Date of Evaluation:  12/07/2018 Referral Source: Step down from [facility] Chief Complaint:  Depression Visit Diagnosis: No diagnosis found.  History of Present Illness: Per assessment note- Kirk Leon is a 28 y.o. Caucasian male presents with depression and suicidal ideations.  Currently denying intent or plan.  Reports a longstanding history with depression and anxiety.  Reports for the past 10 years Kirk Leon has been struggling with depression.  Reports more recently Kirk Leon has experienced a break-up with his girlfriend of 1-1/2 years.  Reports recent diagnosis with COVID.  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  Patient reports feeling " free as Kirk Leon was traveling on the [address]" reports feelings of hopelessness worthlessness.  Reports intermittent bouts of depression.  Patient is followed by [facility] psychiatrist who recently initiated Wellbutrin and trazodone for reported  symptoms.  Evaluation: Kirk Leon.  Continues to present flat, guarded and minimal.  Kirk Leon reports worsening depression after multiple stressors related to relationship situation.  Denies suicidal or homicidal ideations.  Denies auditory visual hallucinations.  Patient was initiated on Seroqual 50 mg QHS  during partial hospitalization programming.  Patient reports ruminations has improved and Kirk Leon is resting better at night.  We will continue to monitor symptoms.  Support encouragement reassurance was provided.  Associated Signs/Symptoms: Depression Symptoms:  depressed mood, feelings of worthlessness/guilt, difficulty concentrating, anxiety, (Hypo) Manic Symptoms:  Distractibility, Irritable Mood, Anxiety Symptoms:  Excessive Worry, Psychotic Symptoms:  Hallucinations: None PTSD Symptoms: NA  Past Psychiatric History: Partial hospitalization programming.  Patient is prescribed Wellbutrin, Seroquel and Vistaril.  History of depression and anxiety  Previous Psychotropic Medications: Yes   Substance Abuse History in the last 12 months:  Yes.    Consequences of Substance Abuse: NA  Past Medical History:  Past Medical History:  Diagnosis Date  . Asthma   . Seasonal allergies     Past Surgical History:  Procedure Laterality Date  . APPENDECTOMY  2012    Family Psychiatric History:   Family History:  Family History  Problem Relation Age of Onset  . Depression Maternal Aunt     Social History:   Social History   Socioeconomic History  . Marital status: Single    Spouse name: Not on file  . Number of children: Not on file  . Years of education: Not on file  . Highest education level: Not on file  Occupational History  . Not on file  Social Needs  . Financial resource strain: Not  hard at all  . Food insecurity    Worry: Never true    Inability: Never true  . Transportation needs    Medical: No    Non-medical: No  Tobacco Use  . Smoking status: Never  Smoker  . Smokeless tobacco: Never Used  Substance and Sexual Activity  . Alcohol use: Yes    Comment: 4-5 mixed drinks 1-2 weekly  . Drug use: Yes    Types: Marijuana    Comment: uses on occasion  . Sexual activity: Not on file  Lifestyle  . Physical activity    Days per week: 7 days    Minutes per session: 30 min  . Stress: Very much  Relationships  . Social connections    Talks on phone: More than three times a week    Gets together: More than three times a week    Attends religious service: More than 4 times per year    Active member of club or organization: Yes    Attends meetings of clubs or organizations: More than 4 times per year    Relationship status: Never married  Other Topics Concern  . Not on file  Social History Narrative  . Not on file    Additional Social History:  Allergies:   Allergies  Allergen Reactions  . Sulfa Antibiotics Hives    Metabolic Disorder Labs: Lab Results  Component Value Date   HGBA1C 4.8 11/28/2018   Lab Results  Component Value Date   PROLACTIN 14.1 11/28/2018   Lab Results  Component Value Date   CHOL 140 11/28/2018   TRIG 93 11/28/2018   HDL 52 11/28/2018   LDLCALC 71 11/28/2018   Lab Results  Component Value Date   TSH 1.090 11/28/2018    Therapeutic Level Labs: No results found for: LITHIUM No results found for: CBMZ No results found for: VALPROATE  Current Medications: Current Outpatient Medications  Medication Sig Dispense Refill  . buPROPion (WELLBUTRIN XL) 300 MG 24 hr tablet Take 1 tablet (300 mg total) by mouth every morning. 30 tablet 2  . hydrOXYzine (ATARAX/VISTARIL) 25 MG tablet Take 1 tablet (25 mg total) by mouth 3 (three) times daily as needed for anxiety. 42 tablet 0  . QUEtiapine (SEROQUEL) 50 MG tablet Take 1 tablet (50 mg total) by mouth at bedtime. 30 tablet 0  . traZODone (DESYREL) 50 MG tablet Take 50 mg by mouth at bedtime.    . traZODone (DESYREL) 50 MG tablet Take 1 tablet (50 mg  total) by mouth at bedtime. (Patient not taking: Reported on 12/02/2018) 30 tablet 0  . zolpidem (AMBIEN CR) 12.5 MG CR tablet Take 1 tablet (12.5 mg total) by mouth at bedtime as needed for sleep. (Patient not taking: Reported on 11/22/2018) 30 tablet 1   No current facility-administered medications for this visit.     Musculoskeletal: Strength & Muscle Tone: within normal limits Gait & Station: normal Patient leans: N/A  Psychiatric Specialty Exam: ROS  There were no vitals taken for this visit.There is no height or weight on file to calculate BMI.  General Appearance: Casual  Eye Contact:  Fair  Speech:  Clear and Coherent  Volume:  Normal  Mood:  Anxious and Depressed  Affect:  Congruent  Thought Process:  Coherent  Orientation:  Full (Time, Place, and Person)  Thought Content:  Logical  Suicidal Thoughts:  No  Homicidal Thoughts:  No  Memory:  Immediate;   Fair Recent;   Fair  Judgement:  Fair  Insight:  Fair  Psychomotor Activity:  Normal  Concentration:  Concentration: Fair  Recall:  [place]  Language: Fair  Akathisia:  NA  Handed:  Right  AIMS (if indicated):    Assets:  Communication Skills Desire for Improvement Resilience Social Support  ADL's:  Intact  Cognition: WNL  Sleep:  Fair   Screenings: AIMS     Admission (Discharged) from OP Visit from 11/10/2018 in [facility]  AIMS Total Score  0    AUDIT     Admission (Discharged) from OP Visit from 11/10/2018 in [facility]  Alcohol Use Disorder Identification Test Final Score (AUDIT)  2    PHQ2-9     Counselor from 11/18/2018 in [place]  PHQ-2 Total Score  6  PHQ-9 Total Score  22      Assessment and Plan:  Admitted to Intensive Outpatient program  -Continue Wellbutrin  300 mg and Seroquel 50 mg QHS   Treatment plan was reviewed and agreed upon by NP. T.Abbrielle Batts and patient  Galileo Colello need for group servies  [name], NP 9/30/202010:17 AM

## 2018-12-07 NOTE — Therapy (Signed)
[place], [place], [address] Phone: [phone]   Fax:  [phone]  Occupational Therapy Treatment  Patient Details  Name: Kirk Leon MRN: [id number] Date of Birth: 10-Oct-1990 Referring Provider (OT): [name], NP  Virtual Visit via Video Note  I connected with Kirk Leon on 12/07/18 at  8:00 AM EDT by a video enabled telemedicine application and verified that I am speaking with the correct person using two identifiers.   I discussed the limitations of evaluation and management by telemedicine and the availability of in person appointments. The patient expressed understanding and agreed to proceed.   I discussed the assessment and treatment plan with the patient. The patient was provided an opportunity to ask questions and all were answered. The patient agreed with the plan and demonstrated an understanding of the instructions.   The patient was advised to call back or seek an in-person evaluation if the symptoms worsen or if the condition fails to improve as anticipated.  I provided 60 minutes of non-face-to-face time during this encounter.   [name], OT    Encounter Date: 12/06/2018  OT End of Session - 12/07/18 1157    Visit Number  9    Number of Visits  12    Date for OT Re-Evaluation  12/16/18    Authorization Type  [employer]    OT Start Time  1100    OT Stop Time  1200    OT Time Calculation (min)  60 min    Activity Tolerance  Patient tolerated treatment well    Behavior During Therapy  WFL for tasks assessed/performed       Past Medical History:  Diagnosis Date  . Asthma   . Seasonal allergies     Past Surgical History:  Procedure Laterality Date  . APPENDECTOMY  2012    There were no vitals filed for this visit.  Subjective Assessment - 12/07/18 1157    Currently in Pain?  No/denies          S: My self esteem is low to moderate   O:Education given  on self esteem and how it relates to daily experiences. Pt asked to give definition of self esteem, and current rating of self esteem. Positive and negative contributors of self esteem to be brainstormed within group in relation to personal experiences. Positive thinking activity then completed for pt to identify several positive traits about themselves. Pt asked to share at end of session.   A: Pt presents with blunted affect, engaged and participatory throughout session. Pt shares how his self esteem is low from feeling as if he needs approval from others. He also feels his self esteem is very impacted by the feelings of others. He shares that being outdoors and making time for hobbies helps to increase his self esteem. Pt shares no other concerns or questions for OT considering today is last day, feels as if all needs are met.  P: OT Group will be x3 per week while pt in PHP. Anticipate d/c this date.               OT Education - 12/07/18 1157    Education Details  ducation given on self esteem    Person(s) Educated  Patient    Methods  Explanation;Handout    Comprehension  Verbalized understanding       OT Short Term Goals - 12/07/18 1158      OT SHORT  TERM GOAL #1   Title  Pt will be educated on strategies to improve psychosocial skills needed to participate fully in all daily, work, and leisure activities    Time  4    Period  Weeks    Status  Achieved    Target Date  12/16/18      OT SHORT TERM GOAL #2   Title  Pt will apply psychosocial skills and coping mechanisms to daily activities in order to function independently and reintegrate into community    Period  Weeks    Status  Achieved    Target Date  12/16/18      OT SHORT TERM GOAL #3   Title  Pt will recall and/or apply 1-3 sleep hygiene strategies to improve function in BADL routine upon reintegrating into community    Time  4    Period  Weeks    Status  Achieved    Target Date  12/16/18      OT SHORT TERM  GOAL #4   Title  Pt will recall 3-5 RTW and school skills needed for successful reintegration into community    Time  4    Period  Weeks    Status  Partially Met    Target Date  12/16/18      OT SHORT TERM GOAL #5   Title  Pt will engage in goal setting to improve functional BADL/IADL routine upon reintegrating into community.    Time  4    Period  Weeks    Status  Achieved    Target Date  12/16/18               Plan - 12/07/18 1157    Occupational performance deficits (Please refer to evaluation for details):  ADL's;IADL's;Rest and Sleep;Education;Work;Leisure;Social Participation    Body Structure / Function / Physical Skills  ADL;IADL    Cognitive Skills  Energy/Drive;Emotional    Psychosocial Skills  Coping Strategies;Interpersonal Interaction;Routines and Behaviors       Patient will benefit from skilled therapeutic intervention in order to improve the following deficits and impairments:   Body Structure / Function / Physical Skills: ADL, IADL Cognitive Skills: Energy/Drive, Emotional Psychosocial Skills: Coping Strategies, Interpersonal Interaction, Routines and Behaviors   Visit Diagnosis: Severe episode of recurrent major depressive disorder, without psychotic features (HCC)  GAD (generalized anxiety disorder)  Difficulty coping    Problem List Patient Active Problem List   Diagnosis Date Noted  . Suicidal ideation 11/10/2018   OCCUPATIONAL THERAPY DISCHARGE SUMMARY  Visits from Start of Care: 9  Current functional level related to goals / functional outcomes: Pt is stepping down to IOP level of care for continued MH maintenance   Remaining deficits: Continuing to implement coping skills learned while in [facility]   Education / Equipment: Education given on psychosocial skills and coping mechanisms as it applies to [employer]   Plan: Patient agrees to discharge.  Patient goals were not met. Patient is being discharged due to meeting the stated rehab  goals.  ?????         [name], MSOT, OTR/L Behavioral Health OT/ Acute Relief OT PHP Office: [place] 12/07/2018, 11:58 AM  [facility] [place], [place], [address] Phone: [phone]   Fax:  [phone]  Name: Kirk Leon MRN: [id number] Date of Birth: [birth year]

## 2018-12-07 NOTE — Progress Notes (Signed)
Virtual Visit via Video Note  I connected with Kirk Leon on 12/07/18 at 0830 by a video enabled telemedicine application and verified that I am speaking with the correct person using two identifiers.  I discussed the limitations of evaluation and management by telemedicine and the availability of in person appointments. The patient expressed understanding and agreed to proceed. I discussed the assessment and treatment plan with the patient. The patient was provided an opportunity to ask questions and all were answered. The patient agreed with the plan and demonstrated an understanding of the instructions.  The patient was advised to call back or seek an in-person evaluation if the symptoms worsen or if the condition fails to improve as anticipated.  I provided 30 minutes of non-face-to-face time during this encounter.  As per previous CCA: Pt presents as referral from [facility]. Pt reports walking in to [facility] per the recommendation of psychiatrist, Dr Donell Beers. Pt states he expressed suicidal thoughts to his father, wishes that the plane pt he was due to be on would crash and he would die. Pt was flying from [place] to where he lives in [place]. Pt reports his father had him fly home to [place] instead and his father arranged for him to see the psychiatrist. Pt reports "a build up of things" over the past month and a half led to where he is now. Pt shares he went [place] and felt "truly happy" consistently for the first time in approximately 10 years. Pt reports he had been feeling "numb" and confusing it for "being okay" and the trip showed him he was wrong. Pt reports conflicting issues with an off and on again girlfriend. Pt identifies that when he had his trip revelation he put his energy into his relationship instead, to distract him. Last week he went to visit his girlfriend in [place] and she broke up with him the day he arrived . Pt shares that without the  distraction of the relationship everything came "crashing in" and he got into a "dark hole." Pt reports realizing he has been depressed for 10 years and overall unfulfilled in life. Pt states additional complication of his mom being sick with chronic leukemia. Pt shares he goes to bed most nights hoping he does not wake up. Pt reports he thinks of suicide daily and this is not new for him and states he does not think about acting on his thoughts and has deterrents of his friends and family, who he would not want to cause pain to. Pt was discharged from [facility] due to denial of SI and testing positive for COVID and desiring to quarantine at home. Pt reports he was started on medication at [facility] and has been taking it and denies negative side effects. Patients Currently Reported Symptoms/Problems: Pt reports depressed mood, lethargy, lack of motivation, rumination, decrease in appetite, passive SI, and panic attacks with symptoms of shortness of breath, heart palipitations, racing thoughts, and feeling warm  Pt transitioned to MH-IOP from PHP today.  Reports feeling a "little better."  States he's taking his meds daily.  Denies HI or A/V hallucinations, but admits to passive SI. Continues to be grieving the loss of his relationship with girlfriend.  A:  Oriented pt to virtual MH-IOP.  Answered all his questions.  Pt gave verbal consent for treatment, to release chart information to referred providers and to complete any forms if needed.  Pt also gave consent for attending group  virtually d/t COVID-19 social distancing restrictions.  Encouraged support groups.  F/U with Dr. Casimiro Needle and Dr. Gershon Mussel Hedding.  R:  Patient receptive.    Dellia Nims, M.Ed,CNA

## 2018-12-07 NOTE — Progress Notes (Signed)
Virtual Visit via Video Note  I connected with Kirk Leon on 12/07/18 at  9:00 AM EDT by a video enabled telemedicine application and verified that I am speaking with the correct person using two identifiers.  Location: Patient: Kirk Leon Provider: Lise Auer, LCSW   I discussed the limitations of evaluation and management by telemedicine and the availability of in person appointments. The patient expressed understanding and agreed to proceed.  History of Present Illness: MDD and GAD   Observations/Objective: Case Manager checked in with all participants to review discharge dates, insurance authorizations, work-related documents and needs for the treatment team. Counselor processed current mood and functioning and discussed how participants spent their time since last session and if skills were applied. Today is Kirk Leon's first day in group, as he recently stepped down from [facility]. Kirk Leon shared about his need for treatment, what he is hoping to learn and about his process in treatment thus far. Kirk Leon presented as nervous and unsure at first, but was able to open up and become more comfortable as the group proceeded.  Counselor engaged the group in completing a handout entitled, "Strengths Exploration", which explored individual strengths and how they can be applied in personal interests, professional arenas and in relationships. Group Members shared their reflections with the group and processed application of information. Kirk Leon identified that he is adventurous, open-minded, and empathetic, among other qualities. Kirk Leon shared what skills he could use to improve his relationships, identifying that he would like to work on his confidence, patience and communication skills. Counselor shared a video with [name] group members could complete to improve self-esteem. Kirk Leon identified that they would like to try video editing to created his self-portrait. Counselor presented information on a  Self-Esteem Visualization that group members processed in group, but will complete over the next 2 weeks on their own time. Kirk Leon identified that he has deep rooted self-esteem issues related to his parents ways of communicating and parenting him, feelings of never being good enough.  Counselor spent time celebrating the participation and progress of a graduating group member. The graduating member shared about her treatment journey and showed gratitude for all who where part of the process. Group members shared kind, encouraging and inspiring words with the graduating member to wrap up the session.   Assessment and Plan: Counselor recommends that patient remains in IOP treatment to better manage mental health symptoms and continue to address treatment plan goals. Counselor recommends adherence to crisis/safety plan, taking medications as prescribed and following up with medical professionals if any issues arise.   Follow Up Instructions: Counselor will send Webex link for next session.    I discussed the assessment and treatment plan with the patient. The patient was provided an opportunity to ask questions and all were answered. The patient agreed with the plan and demonstrated an understanding of the instructions.   The patient was advised to call back or seek an in-person evaluation if the symptoms worsen or if the condition fails to improve as anticipated.  I provided 180 minutes of non-face-to-face time during this encounter.   Lise Auer, LCSW

## 2018-12-08 ENCOUNTER — Encounter (HOSPITAL_COMMUNITY): Payer: Self-pay | Admitting: Psychiatry

## 2018-12-08 ENCOUNTER — Encounter (HOSPITAL_COMMUNITY): Payer: Self-pay

## 2018-12-08 ENCOUNTER — Other Ambulatory Visit: Payer: Self-pay

## 2018-12-08 ENCOUNTER — Other Ambulatory Visit (HOSPITAL_COMMUNITY): Payer: BLUE CROSS/BLUE SHIELD | Attending: Licensed Clinical Social Worker | Admitting: Psychiatry

## 2018-12-08 DIAGNOSIS — R110 Nausea: Secondary | ICD-10-CM | POA: Insufficient documentation

## 2018-12-08 DIAGNOSIS — Z882 Allergy status to sulfonamides status: Secondary | ICD-10-CM | POA: Insufficient documentation

## 2018-12-08 DIAGNOSIS — K30 Functional dyspepsia: Secondary | ICD-10-CM | POA: Insufficient documentation

## 2018-12-08 DIAGNOSIS — J45909 Unspecified asthma, uncomplicated: Secondary | ICD-10-CM | POA: Insufficient documentation

## 2018-12-08 DIAGNOSIS — F332 Major depressive disorder, recurrent severe without psychotic features: Secondary | ICD-10-CM | POA: Diagnosis present

## 2018-12-08 DIAGNOSIS — R519 Headache, unspecified: Secondary | ICD-10-CM | POA: Insufficient documentation

## 2018-12-08 DIAGNOSIS — F411 Generalized anxiety disorder: Secondary | ICD-10-CM | POA: Diagnosis not present

## 2018-12-08 DIAGNOSIS — Z79899 Other long term (current) drug therapy: Secondary | ICD-10-CM | POA: Insufficient documentation

## 2018-12-08 NOTE — Progress Notes (Signed)
Virtual Visit via Video Note  I connected with Kirk Leon on 12/08/18 at  9:00 AM EDT by a video enabled telemedicine application and verified that I am speaking with the correct person using two identifiers.  Location: Patient: Kirk Leon Provider: Lise Auer, LCSW   I discussed the limitations of evaluation and management by telemedicine and the availability of in person appointments. The patient expressed understanding and agreed to proceed.  History of Present Illness: MDD and GAD   Observations/Objective: Counselor checked in with Kirk Leon to determine current mood, symptoms, daily functioning and skills applied from future sessions. Kirk Leon shared that he had a distressing conversation with his dad yesterday, which caused him increased anxiety and lowered self-esteem. Kirk Leon stated that his sleep was, "ok". Kirk Leon presented as more sad/depressed than how he presented yesterday. Counselor and Kirk Leon discussed his family make up, the dynamics between him and his parents, in comparison with his parents and his sister. Kirk Leon shared about his life travels, education and professional career. Kirk Leon is at a crossroad about what direction to go in for graduate school and his career. Counselor and Kirk Leon explored his thought process, influences, talents and desires. Counselor and Kirk Leon processed the impact of influences in his discission, as well as his challenges with boundary setting, which impacts his self-esteem. Counselor explored relationship history, especially related to his recent break up, what he would like in a partner, communication skills and what he has to attract or offer a future partner. Kirk Leon expressed mush self-doubt and uncertainty. He would like to work more intensely in these areas through IOP interventions and self-work/application.   Assessment and Plan: Counselor recommends that patient remains in IOP treatment to better manage mental health symptoms and continue to address treatment plan  goals. Counselor recommends adherence to crisis/safety plan, taking medications as prescribed and following up with medical professionals if any issues arise.   Follow Up Instructions: Counselor will send Webex link for next session.    I discussed the assessment and treatment plan with the patient. The patient was provided an opportunity to ask questions and all were answered. The patient agreed with the plan and demonstrated an understanding of the instructions.   The patient was advised to call back or seek an in-person evaluation if the symptoms worsen or if the condition fails to improve as anticipated.  I provided 180 minutes of non-face-to-face time during this encounter.   Lise Auer, LCSW

## 2018-12-09 ENCOUNTER — Other Ambulatory Visit: Payer: Self-pay

## 2018-12-09 ENCOUNTER — Other Ambulatory Visit (HOSPITAL_COMMUNITY): Payer: BLUE CROSS/BLUE SHIELD | Admitting: Psychiatry

## 2018-12-09 ENCOUNTER — Encounter (HOSPITAL_COMMUNITY): Payer: Self-pay

## 2018-12-09 DIAGNOSIS — F411 Generalized anxiety disorder: Secondary | ICD-10-CM

## 2018-12-09 DIAGNOSIS — F332 Major depressive disorder, recurrent severe without psychotic features: Secondary | ICD-10-CM | POA: Diagnosis not present

## 2018-12-09 NOTE — Progress Notes (Signed)
Virtual Visit via Video Note  I connected with Kirk Leon on 12/09/18 at  9:00 AM EDT by a video enabled telemedicine application and verified that I am speaking with the correct person using two identifiers.  Location: Patient: Kirk Leon Provider: Lise Auer, LCSW   I discussed the limitations of evaluation and management by telemedicine and the availability of in person appointments. The patient expressed understanding and agreed to proceed.  History of Present Illness: MDD and GAD   Observations/Objective: Case Manager checked in with all participants to review discharge dates, insurance authorizations, work-related documents and needs for the treatment team. Counselor processed current mood and functioning and discussed how participants spent their time since last session and if skills were applied. Kirk Leon shared that he met with his personal therapist since our session yesterday and found it helpful. He took time out for self-care and reflection, sitting in nature and engaged in deep breathing. Counselor engaged the group in reviewing two articles about the benefits of structuring and organizing their lives to promote being their best selves. Group members identified the skills and strategies they could incorporate in their lives for overall wellness. Kirk Leon stated that depending on where he is living, his life is more or less structured. He likes structuring his days and being flexible in the evenings for social or recreational activities. He is craving structure right now, due to being out of his routine for months. He identified ways to reincorporate peace in his life through the skills discussed. Counselor engaged the group in a guided imagery named Self-Esteem Relaxation, allowing the graduating group member to make the selection. Group members gave feedback on their experience. Kirk Leon stated that he found his mind wandering during the exercise and the Counselor emphasized that it was ok  and it was a skill to practice over time for full benefits. Counselor spent time celebrating the participation and progress of a graduating group member. The graduating member shared about her treatment journey and showed gratitude for all who where part of the process. Group members shared kind, encouraging and inspiring words with the graduating member to wrap up the session.   Assessment and Plan: Counselor recommends that patient remains in IOP treatment to better manage mental health symptoms and continue to address treatment plan goals. Counselor recommends adherence to crisis/safety plan, taking medications as prescribed and following up with medical professionals if any issues arise.   Follow Up Instructions: Counselor will send Webex link for next session.    I discussed the assessment and treatment plan with the patient. The patient was provided an opportunity to ask questions and all were answered. The patient agreed with the plan and demonstrated an understanding of the instructions.   The patient was advised to call back or seek an in-person evaluation if the symptoms worsen or if the condition fails to improve as anticipated.  I provided 180 minutes of non-face-to-face time during this encounter.   Lise Auer, LCSW

## 2018-12-12 ENCOUNTER — Other Ambulatory Visit (HOSPITAL_COMMUNITY): Payer: BLUE CROSS/BLUE SHIELD | Admitting: Psychiatry

## 2018-12-12 ENCOUNTER — Encounter (HOSPITAL_COMMUNITY): Payer: Self-pay | Admitting: Psychiatry

## 2018-12-12 ENCOUNTER — Telehealth (HOSPITAL_COMMUNITY): Payer: Self-pay | Admitting: Psychiatry

## 2018-12-12 ENCOUNTER — Other Ambulatory Visit: Payer: Self-pay

## 2018-12-12 DIAGNOSIS — F332 Major depressive disorder, recurrent severe without psychotic features: Secondary | ICD-10-CM | POA: Diagnosis not present

## 2018-12-12 DIAGNOSIS — F411 Generalized anxiety disorder: Secondary | ICD-10-CM

## 2018-12-12 NOTE — Progress Notes (Signed)
Virtual Visit via Telephone Note  I connected with [name] on 12/12/18 at  9:00 AM EDT by telephone and verified that I am speaking with the correct person using two identifiers.  I discussed the limitations, risks, security and privacy concerns of performing an evaluation and management service by telephone and the availability of in person appointments. I also discussed with the patient that there may be a patient responsible charge related to this service. The patient expressed understanding and agreed to proceed. I discussed the assessment and treatment plan with the patient. The patient was provided an opportunity to ask questions and all were answered. The patient agreed with the plan and demonstrated an understanding of the instructions.  The patient was advised to call back or seek an in-person evaluation if the symptoms worsen or if the condition fails to improve as anticipated.  I provided 15 minutes of non-face-to-face time during this encounter.   D:  Group leader stated that pt had questions for case manager.  Placed call to pt.  Pt c/o ongoing stomach pains and recent severe h/a's.  Writer inquired if he had his blood pressure checked.  According to pt, he had b/p checked along with getting bloodwork drawn over the weekend.  He also states there's some confusion as to who is in charge of his medication mgmt at this time.  A:  Provided pt with support.  Re-iterated to pt as long as he's in [place], [name], NP will be in charge; but once he discharges, Dr. Casimiro Needle will be over his meds.  Informed pt that Ludger Nutting is aware of his sx's and she will be in the office tomorrow.  Also, re-iterated how MH-IOP functions differently from [facility] in regards to seeing the provider every week.  Inform [name], NP.  R:  Pt receptive.    Dellia Nims, M.Ed,CNA

## 2018-12-12 NOTE — Progress Notes (Signed)
Virtual Visit via Video Note  I connected with Kirk Leon on 12/12/18 at  9:00 AM EDT by a video enabled telemedicine application and verified that I am speaking with the correct person using two identifiers.  Location: Patient: Kirk Leon Provider: Lise Auer, LCSW   I discussed the limitations of evaluation and management by telemedicine and the availability of in person appointments. The patient expressed understanding and agreed to proceed.  History of Present Illness: MDD and GAD   Observations/Objective: Case Manager checked in with all participants to review discharge dates, insurance authorizations, work-related documents and needs for the treatment team. Counselor processed current mood and functioning and discussed how participants spent their time since last session and if skills were applied. [name] shared about his weekend which involved increase in side effects from medications, particularly impacting his digestive system. [name] shared that he was able to discuss issues with his psychiatrist. [name] identified that his anxiety is increasing with the increase of physical side effects and how that is impacting his ability to make decisions about his future. Counselor processed thoughts and feelings with [name] and elicited feedback from the group for encouragement and support.   Counselor shared information on The 5 Love Languages, then prompted group members to take the quiz. Group members shared their results and Counselor provided psychoeducation on how to connect results with application within relationships. [name] discovered that his love language is Words of Affirmation and gave examples of how he could apply this knowledge in future and current relationships.  Counselor provided group with a video named, "The Cycle of Anxiety and Avoidance" and a connected handout breaking down the cycle. Counselor explored thoughts and feelings related to the video and handout with group members.  [name] identified that "My issues add to my anxiety and my anxiety adds to my issues." He discussed a pattern of avoiding making decisions due to the pressure he feels from others in his life. Counselor engaged group in a final handout called, "Worry Exploration", teaching the skill of changing the process of ruminations to change over to positive psychology. Group members each reported on their responses on the handout. [name] shared about two worries, 1) About making the wrong life decisions and 2) Fear of never feeling the same happiness he felt with his ex. [name] was able to share insights related to the other prompts, showing some depressive cognitions, with glimmers of hope and positivity. Group closed out by sharing one positive affirmation about themselves and each group member. [name] was easily able to identify positive attributes in others, and was hesitant about affirming himself.   Assessment and Plan: Counselor recommends that patient remains in IOP treatment to better manage mental health symptoms and continue to address treatment plan goals. Counselor recommends adherence to crisis/safety plan, taking medications as prescribed and following up with medical professionals if any issues arise.   Follow Up Instructions: Counselor will send Webex link for next session.    I discussed the assessment and treatment plan with the patient. The patient was provided an opportunity to ask questions and all were answered. The patient agreed with the plan and demonstrated an understanding of the instructions.   The patient was advised to call back or seek an in-person evaluation if the symptoms worsen or if the condition fails to improve as anticipated.  I provided 180 minutes of non-face-to-face time during this encounter.   Lise Auer, LCSW

## 2018-12-12 NOTE — Psych (Signed)
Virtual Visit via Video Note  I connected with Kirk Leon on 12/06/18 at  9:00 AM EDT by a video enabled telemedicine application and verified that I am speaking with the correct person using two identifiers.   I discussed the limitations of evaluation and management by telemedicine and the availability of in person appointments. The patient expressed understanding and agreed to proceed.  I discussed the assessment and treatment plan with the patient. The patient was provided an opportunity to ask questions and all were answered. The patient agreed with the plan and demonstrated an understanding of the instructions.   The patient was advised to call back or seek an in-person evaluation if the symptoms worsen or if the condition fails to improve as anticipated.  Pt was provided 240 minutes of non-face-to-face time during this encounter.   [name], LCSW    [facility] [place] [facility] THERAPIST PROGRESS NOTE  Kirk Leon [id number]  Session Time: 9:00 - 10:00  Participation Level: Active  Behavioral Response: CasualAlertDepressed  Type of Therapy: Group Therapy  Treatment Goals addressed: Coping  Interventions: CBT, DBT, Supportive and Reframing  Summary:  Clinician led check-in regarding current stressors and situation, and review of patient completed daily inventory. Clinician utilized active listening and empathetic response and validated patient emotions. Clinician facilitated processing group on pertinent issues.   Therapist Response: Kirk Leon is a 28 y.o. male who presents with depression and anxiety symptoms. Patient arrived within time allowed and reports that he is feeling "not great." Patient rates his mood at a 4.5 on a scale of 1-10 with 10 being great. Pt reports he "slept terribly" and got approximately 4.5 hours due to rumination. Pt shares his afternoon started out "good" and that he played basketball. Pt reports he had a family therapy session later int he  afternoon and his anxiety was difficult to control afterwards. Pt reports continued belief that he cannot be "better" while he remains in [place] and is in limbo until he can return to [place]. Pt reports the therapist agreed with his parents that it is too soon to return and that upset pt. Pt states the parameters for being able to return are vague and dismisses the option to leave without their approval. Pt continues to struggle with acceptance and own his power. Pt able to process.  Patient engaged in discussion.      Session Time: 10:00 -11:00  Participation Level: Active  Behavioral Response: CasualAlertDepressed  Type of Therapy: Group Therapy, psychoeducation, psychotherapy  Treatment Goals addressed: Coping  Interventions: CBT, DBT, Solution Focused, Supportive and Reframing  Summary: Cln led discussion on unhealthy relationship dynamics. Group members discussed issues with knowing when to walk away, how to know if the unhealthy dynamic is an exception or rule; and how to manage the struggle between feelings and logic.  Therapist Response: Pt participates in discussion. Pt shares conflict regarding his ex-girlfriend and not wanting to close that door. Pt repeats feelings and arguments he has processed previously in group. Pt accepts feedback and is resistant to any suggestion that this dynamic is not healthy. Pt identifies struggle between feelings and logic. Pt connects with making "temporary" decisions rather than "forever" decisions and will not speak with her until mid-November.        Session Time: 11:00- 12:00  Participation Level: Active  Behavioral Response: CasualAlertDepressed  Type of Therapy: Group Therapy, Psychoeducation; Psychotherapy  Treatment Goals addressed: Coping  Interventions: CBT; Solution focused; Supportive; Reframing  Summary: Clinician introduced topic of  vulnerability. Group viewed TED talk "The power of  vulnerability."  Therapist Response: Patient engaged in discussion on vulnerability and reports understanding of what vulnerability is and how it can affect [place].       Session Time: 12:00 -1:00  Participation Level:Active  Behavioral Response:CasualAlertDepressed  Type of Therapy: Group Therapy, OT  Treatment Goals addressed: Coping  Interventions:Psychosocial skills training, Supportive,   Summary:12:00 - 12:50:Occupational Therapy group 12:50 -1:00 Clinician led check-out. Clinician assessed for immediate needs, medication compliance and efficacy, and safety concerns   Therapist Response: 12:00 - 12:50: Patient engaged in group. See OT note.  12:50 - 1:00: At check-out, patient rates his mood at a 5 on a scale of 1-10 with 10 being great. Pt states afternoon plans of taking a walk and doing video editing. Patient demonstrates some progress as evidenced by reaching out and texting friends when upset. Patient denies SI/HI/self-harm at the end of group.     Suicidal/Homicidal: Nowithout intent/plan  Plan: Pt will discharge from PHP due to meeting treatment goals of decreased depression and anxiety symptoms, increased ability to manage symptoms in a healthy manner when they arise, and decreased passive SI. Pt and provider are aligned with discharge. Pt will step down to IOP within this agency to increase further stabilization. Pt will begin IOP tomorrow, 9/30. Pt denies SI/HI at time of discharge.   Diagnosis: Severe episode of recurrent major depressive disorder, without psychotic features (HCC) [F33.2]    1. Severe episode of recurrent major depressive disorder, without psychotic features (HCC)   2. Difficulty coping       [name], LCSW 12/12/2018

## 2018-12-12 NOTE — Telephone Encounter (Signed)
D:  Pt's father had phoned and left case mgr a vm requesting a return call.  A:  Placed call to pt's father, informing him that Probation officer doesn't have a release to speak to him, but could listen to his concerns.  Pt's father states his son (pt) is having severe stomach pains and headaches.  States pt has an appt with PCP again today.  Pt's father inquired about emergency/crisis # for his son.  Case mgr contacted pt and reiterated what pt should do when in a crisis.  Provided pt with the crisis # and told him going to the nearest ED is another option.  Also, mentioned he could always reach out to his outside providers (Drs. Lenora Boys and [name]). Pt denies any SI; just states he's physically in pain.  Strongly encouraged pt to f/u with PCP today.  Will inform [name], NP.  R:  Pt receptive.

## 2018-12-12 NOTE — Progress Notes (Signed)
Virtual Visit via Telephone Note  I connected with Kirk Leon on 12/12/18 at  9:00 AM EDT by telephone and verified that I am speaking with the correct person using two identifiers.   I discussed the limitations, risks, security and privacy concerns of performing an evaluation and management service by telephone and the availability of in person appointments. I also discussed with the patient that there may be a patient responsible charge related to this service. The patient expressed understanding and agreed to proceed.  I discussed the assessment and treatment plan with the patient. The patient was provided an opportunity to ask questions and all were answered. The patient agreed with the plan and demonstrated an understanding of the instructions.   The patient was advised to call back or seek an in-person evaluation if the symptoms worsen or if the condition fails to improve as anticipated.  I provided 15 minutes of non-face-to-face time during this encounter.   Kirk Center, NP   NP followed up with patient regarding her reported symptoms of GI upset and worsening headaches as he attributes to medication side effects.  Reported recent follow-up with his attending psychiatrist Kirk Leon who increased Wellbutrin 150 mg to 300 mg.  Was reported psychiatrist did not feel symptoms was related to medications.  [name] reported a follow-up appointment on 10/6 with his primary care provider to rule out acute virus.-Pending results.  Of note patient was recently quarantine due to positive COVID testing.  Patient to continue medications as directed.  Kirk Ala NP [name] -Intensive outpatient programming 12/12/2018 11:22

## 2018-12-13 ENCOUNTER — Encounter (HOSPITAL_COMMUNITY): Payer: Self-pay | Admitting: Psychiatry

## 2018-12-13 ENCOUNTER — Other Ambulatory Visit (HOSPITAL_COMMUNITY): Payer: BLUE CROSS/BLUE SHIELD | Admitting: Psychiatry

## 2018-12-13 ENCOUNTER — Other Ambulatory Visit: Payer: Self-pay

## 2018-12-13 DIAGNOSIS — F411 Generalized anxiety disorder: Secondary | ICD-10-CM

## 2018-12-13 DIAGNOSIS — F332 Major depressive disorder, recurrent severe without psychotic features: Secondary | ICD-10-CM

## 2018-12-13 NOTE — Progress Notes (Signed)
Virtual Visit via Video Note  I connected with Kirk Leon on 12/13/18 at  9:00 AM EDT by a video enabled telemedicine application and verified that I am speaking with the correct person using two identifiers.  Location: Patient: Kirk Leon Provider: Lise Auer, LCSW   I discussed the limitations of evaluation and management by telemedicine and the availability of in person appointments. The patient expressed understanding and agreed to proceed.  History of Present Illness: MDD and GAD   Observations/Objective: Case Manager checked in with all participants to review discharge dates, insurance authorizations, work-related documents and needs for the treatment team. Counselor processed current mood and functioning and discussed how participants spent their time since last session and if skills were applied. Kirk Leon shared that he continues to have severe headaches and stomachaches. He stated that he would try his best to engage today. Kirk Leon is set to have blood work completed this afternoon. Counselor prompted the group to write down 5 comforts, sharing how comfort items can be used for grounding. Kirk Leon shared about the music he enjoys and foods that are comforting to him.  Counselor engaged the group in a therapeutic activity called "Inside-Out Masks" to explore how the present externally vs. what they are experiencing internally. Kirk Leon wasn't willing to show his image, however he explained that internally he is feeling anxious, exhausted, like an imposture, like he doesn't belong and is not enough. Externally he shows confident, high functioning, funny, and that he is faking it.  Counselor shared strategies on communicating about their Mental Health with friends, family, co-workers and strangers, using the "W.I.S.E. Method". Group members shared ways they could implement this method. Counselor shared a video and article on the topic of strategies to use when making major life decisions, as all group  members report being at a crossroads in how they would like to move forward in life. Kirk Leon explored his future decisions more and how he is processing it more with his friends and family. Counselor offered the graduating group member to choose a guided imagery for the group to engage in. She chose "Finding Your Authentic Self". Group members shared feedback from the experience. Kirk Leon shared that he was able to engage in the activity better today than in the past. His thoughts were able to be redirected back more easily. Counselor wrapped up the session by allowing group members to celebrate and encourage a graduating group member. The graduating member shared about her progress and takeaways from group and her encouraging words for the remaining group members.   Assessment and Plan: Counselor recommends that patient remains in IOP treatment to better manage mental health symptoms and continue to address treatment plan goals. Counselor recommends adherence to crisis/safety plan, taking medications as prescribed and following up with medical professionals if any issues arise.   Follow Up Instructions: Counselor will send Webex link for next session.    I discussed the assessment and treatment plan with the patient. The patient was provided an opportunity to ask questions and all were answered. The patient agreed with the plan and demonstrated an understanding of the instructions.   The patient was advised to call back or seek an in-person evaluation if the symptoms worsen or if the condition fails to improve as anticipated.  I provided 180 minutes of non-face-to-face time during this encounter.   Lise Auer, LCSW

## 2018-12-14 ENCOUNTER — Encounter (HOSPITAL_COMMUNITY): Payer: Self-pay | Admitting: Psychiatry

## 2018-12-14 ENCOUNTER — Other Ambulatory Visit: Payer: Self-pay

## 2018-12-14 ENCOUNTER — Other Ambulatory Visit (HOSPITAL_COMMUNITY): Payer: BLUE CROSS/BLUE SHIELD | Admitting: Psychiatry

## 2018-12-14 ENCOUNTER — Telehealth (HOSPITAL_COMMUNITY): Payer: Self-pay | Admitting: Psychiatry

## 2018-12-14 DIAGNOSIS — F411 Generalized anxiety disorder: Secondary | ICD-10-CM

## 2018-12-14 DIAGNOSIS — F332 Major depressive disorder, recurrent severe without psychotic features: Secondary | ICD-10-CM | POA: Diagnosis not present

## 2018-12-14 NOTE — Telephone Encounter (Signed)
D:  Placed call to pt, since he hadn't logged into virtual IOP group.  Pt states he will be logging on; c/o headaches still.  A:  Inform treatment team.

## 2018-12-14 NOTE — Progress Notes (Signed)
Virtual Visit via Video Note  I connected with Kirk Leon on 12/14/18 at  9:00 AM EDT by a video enabled telemedicine application and verified that I am speaking with the correct person using two identifiers.  Location: Patient: Kirk Leon Provider: Lise Auer, LCSW   I discussed the limitations of evaluation and management by telemedicine and the availability of in person appointments. The patient expressed understanding and agreed to proceed.  History of Present Illness: MDD and GAD   Observations/Objective: Case Manager checked in with all participants to review discharge dates, insurance authorizations, work-related documents and needs for the treatment team. Counselor processed current mood and functioning and discussed how participants spent their time since last session and if skills were applied. Kirk Leon shared that he continues to have a headache and stomach issues. He reported getting his blood work completed yesterday. He stated that he had more energy and was able to get a few things accomplished. Counselor introduced our guest speaker, Einar Grad, [place] Pharmacist, who shared about psychiatric medications, side effects, treatment considerations and how to communicate with medical professionals. Each group member asked questions and shared medication concerns. Counselor prompted group members to reference a worksheet called, "Body Scan" to jot down questions and concerns about their physical health in preparation for their upcoming appointments with medical professionals. Counselor allowed each participant to share their ideas with the group. Kirk Leon shared about being diagnosed with COVID twice in the past 6 months, concerns about his hair coming out in clumps, need for a dental appointment and his chronic cough. When he returns to [facility] be plans to get involved in a therapy group there and discussed his need for identifying a psychiatrist in [place]. Counselor encouraged routine medical  check-ups, preparing for appointments, following up with recommendations and seeking specialist if needed.  Counselor introduced a handout called the "Self-care Wheel" which highlighted 6 different life domains and shared self-care tasks in each category. Group members discussed each domain and the self-care ideas they would like to add to their daily lives to improve overall well-being. Kirk Leon shared that his higher domains are his physcial health and psychological health. The midrange of concern were emotional and spiritual wellness. Where he would like to grow and develop more strengths are in his professional and personal life. Kirk Leon shared that he would like to focus on self-compassion, affirmations, and learning who he is more. Counselor wrapped up session by group members sharing about overall depression and anxiety scale, as well as sharing their plans for the afternoon. Kirk Leon stated that his anxiety is moderate and his depression mild to moderate. He plans on calling a graduate program today to get more information about enrollment.    Assessment and Plan: Counselor recommends that patient remains in IOP treatment to better manage mental health symptoms and continue to address treatment plan goals. Counselor recommends adherence to crisis/safety plan, taking medications as prescribed and following up with medical professionals if any issues arise.   Follow Up Instructions: Counselor will send Webex link for next session.    I discussed the assessment and treatment plan with the patient. The patient was provided an opportunity to ask questions and all were answered. The patient agreed with the plan and demonstrated an understanding of the instructions.   The patient was advised to call back or seek an in-person evaluation if the symptoms worsen or if the condition fails to improve as anticipated.  I provided 180 minutes of non-face-to-face time during this encounter.  Lise Auer, LCSW

## 2018-12-15 ENCOUNTER — Other Ambulatory Visit: Payer: Self-pay

## 2018-12-15 ENCOUNTER — Other Ambulatory Visit (HOSPITAL_COMMUNITY): Payer: BLUE CROSS/BLUE SHIELD | Admitting: Psychiatry

## 2018-12-15 DIAGNOSIS — F411 Generalized anxiety disorder: Secondary | ICD-10-CM

## 2018-12-15 DIAGNOSIS — F332 Major depressive disorder, recurrent severe without psychotic features: Secondary | ICD-10-CM | POA: Diagnosis not present

## 2018-12-15 NOTE — Progress Notes (Signed)
Virtual Visit via Video Note  I connected with Kirk Leon on 12/15/18 at  9:00 AM EDT by a video enabled telemedicine application and verified that I am speaking with the correct person using two identifiers.  Location: Patient: Kirk Leon Provider: Lise Auer, LCSW   I discussed the limitations of evaluation and management by telemedicine and the availability of in person appointments. The patient expressed understanding and agreed to proceed.  History of Present Illness: MDD and GAD  Observations/Objective: Case Manager checked in with all participants to review discharge dates, insurance authorizations, work-related documents and needs for the treatment team. Counselor introduced guest speaker, Jeanella Craze, [place], to facilitate a discussion around Grief and Loss topics. Kirk Leon participated well in the discussion. He became aware of how he is moving through grief and loss with recent events. Counselor allowed time for reflection and journaling. Kirk Leon reflected that he has felt some powerlessness, that he wants to acknowledge and express his feelings more, instead of stuffing them down. Counselor engaged the group in an open discussion about Mental Health Resources they use or are aware of. Each group member shared, then the Counselor shared an exhaustive list of local, state, and national resources, as well as, aps and books that have been recommended. Counselor invited a Agricultural consultant from [place] to present on Coffee groups, peer support, and wellness academy. Counselor prompted group members to identify which resources they are likely to look into and utilize to better maintain mental health. Kirk Leon shared a couple of ideas with the group, asked good questions and stated that he would like to invite his family to the "Family and Friends" mental health support group and that he will look into some of the aps recommended. Counselor closed session by  group members sharing their plans for the afternoon. 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 He continues to have headaches and GI issues related to medications.    Assessment and Plan: Counselor recommends that patient remains in IOP treatment to better manage mental health symptoms and continue to address treatment plan goals. Counselor recommends adherence to crisis/safety plan, taking medications as prescribed and following up with medical professionals if any issues arise.   Follow Up Instructions: Counselor will send Webex link for next session.    I discussed the assessment and treatment plan with the patient. The patient was provided an opportunity to ask questions and all were answered. The patient agreed with the plan and demonstrated an understanding of the instructions.   The patient was advised to call back or seek an in-person evaluation if the symptoms worsen or if the condition fails to improve as anticipated.  I provided 180 minutes of non-face-to-face time during this encounter.   Lise Auer, LCSW

## 2018-12-16 ENCOUNTER — Encounter (HOSPITAL_COMMUNITY): Payer: Self-pay | Admitting: Psychiatry

## 2018-12-16 ENCOUNTER — Other Ambulatory Visit (HOSPITAL_COMMUNITY): Payer: BLUE CROSS/BLUE SHIELD | Admitting: Psychiatry

## 2018-12-16 ENCOUNTER — Encounter (HOSPITAL_COMMUNITY): Payer: Self-pay

## 2018-12-16 ENCOUNTER — Other Ambulatory Visit: Payer: Self-pay

## 2018-12-16 DIAGNOSIS — F332 Major depressive disorder, recurrent severe without psychotic features: Secondary | ICD-10-CM

## 2018-12-16 DIAGNOSIS — F411 Generalized anxiety disorder: Secondary | ICD-10-CM

## 2018-12-16 NOTE — Progress Notes (Signed)
Virtual Visit via Video Note  I connected with Kirk Leon on 12/16/18 at  9:00 AM EDT by a video enabled telemedicine application and verified that I am speaking with the correct person using two identifiers.  Location: Patient: Kirk Leon Provider: Lise Auer, LCSW   I discussed the limitations of evaluation and management by telemedicine and the availability of in person appointments. The patient expressed understanding and agreed to proceed.  History of Present Illness: MDD and GAD   Observations/Objective: Case Manager checked in with all participants to review discharge dates, insurance authorizations, work-related documents and needs for the treatment team. Counselor processed current mood and functioning and discussed how participants spent their time since last session and if skills were applied. [name] logged into group late due to sleep issues. He was unable to fall asleep until 4am due to his ongoing headaches. He states today that his depression and anxiety are mild. He talked with his psychiatrist and will stated tapering off a medication to decrease headaches. Counselor engaged the group in a therapeutic activity, creating their Ecomaps. Counselor shared an example and walked them through creating their own. Each group member shared their reflections on their Ecomap, identifying the areas they would like to focus more energy and acknowledging their support system. Kirk Leon shared that he has a solid relationship with is friends and is getting abetter handle on his mental health. He would like things to improve with his parents and in his future plans. Counselor shared psychoinformation related to childhood traumas through explaining and administering the ACEs questionnaire. Group members shared their scores and how the can see the information connecting with their own health/mental health and within their family of origin. Kirk Leon scored a 2 out of 10 and shared more about his relationship with  his parents as a child and the impact those dynamics have on him today. Counselor shared an additional video about the topic and encouraged all group members to explore this area of their mental health in more detail with their personal counselors, requesting trauma informed treatment. Counselor administered the Resiliency Survey with the group to discuss the positive aspects of their childhoods and how they have overcome obstacles with the support of healthy connections and caregivers. Each group member shared their score and identified their childhood supports. Kirk Leon scored a 10/14 and shared that being the first born in his extended family caused most all family members to make him feel special as a child. Counselor wrapped up session by group members sharing a gratitude statement, as well as sharing their plans for the weekend. Kirk Leon is grateful for his friend group. 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    Assessment and Plan: Counselor recommends that patient remains in IOP treatment to better manage mental health symptoms and continue to address treatment plan goals. Counselor recommends adherence to crisis/safety plan, taking medications as prescribed and following up with medical professionals if any issues arise.   Follow Up Instructions: Counselor will send Webex link for next session.    I discussed the assessment and treatment plan with the patient. The patient was provided an opportunity to ask questions and all were answered. The patient agreed with the plan and demonstrated an understanding of the instructions.   The patient was advised to call back or seek an in-person evaluation if the symptoms worsen or if the condition fails to improve as anticipated.  I provided 180 minutes of non-face-to-face  time during this encounter.   Lise Auer, LCSW

## 2018-12-19 ENCOUNTER — Other Ambulatory Visit (HOSPITAL_COMMUNITY): Payer: BLUE CROSS/BLUE SHIELD | Admitting: Psychiatry

## 2018-12-19 ENCOUNTER — Telehealth (HOSPITAL_COMMUNITY): Payer: Self-pay | Admitting: Psychiatry

## 2018-12-19 ENCOUNTER — Other Ambulatory Visit: Payer: Self-pay

## 2018-12-19 NOTE — Telephone Encounter (Signed)
D:  Pt phoned and left vm wanting case manager to call him.  A:  Placed call to pt.  Pt c/o feeling overly anxious today, dizzy, and trouble sleeping.  "I'm wondering if the Wellbutrin is too high?"  Writer will inform [name], NP.

## 2018-12-20 ENCOUNTER — Other Ambulatory Visit (HOSPITAL_COMMUNITY): Payer: BLUE CROSS/BLUE SHIELD | Admitting: Family

## 2018-12-20 ENCOUNTER — Encounter (HOSPITAL_COMMUNITY): Payer: Self-pay | Admitting: Family

## 2018-12-20 DIAGNOSIS — F332 Major depressive disorder, recurrent severe without psychotic features: Secondary | ICD-10-CM

## 2018-12-20 DIAGNOSIS — F411 Generalized anxiety disorder: Secondary | ICD-10-CM

## 2018-12-20 MED ORDER — QUETIAPINE FUMARATE 25 MG PO TABS: 25.0000 mg | ORAL_TABLET | Freq: Every day | ORAL | 0 refills | Status: AC

## 2018-12-20 NOTE — Progress Notes (Signed)
Virtual Visit via Video Note  I connected with [name] on 12/20/18 at  9:00 AM EDT by a video enabled telemedicine application and verified that I am speaking with the correct person using two identifiers.  Location: Patient: Kirk Leon Provider: Lise Auer, LCSW   I discussed the limitations of evaluation and management by telemedicine and the availability of in person appointments. The patient expressed understanding and agreed to proceed.  History of Present Illness: MDD and GAD   Observations/Objective: Case Manager checked in with all participants to review discharge dates, insurance authorizations, work-related documents and needs for the treatment team. Counselor processed current mood and functioning and discussed how participants spent their time since last session and if skills were applied. [name] updated the group on his medication issues, sharing that he is tapering off of Seroquel in hopes that the headache and GI issues will subside. He is hopeful to return to living on his own again soon. Counselor engaged the group in an activity called, "Cognitive Distortions of Self." Group members shared feedback on their written reflections. [name] was able to complete the activity identifying both the positives and unhealthy attributes he's believed about himself. Counselor introduced the group to the concept of Right Brain vs Left Brain Functions. Counselor shared an article and video explaining the role of emotions and logic, as well as strategies in balancing these functions for sound decision making. Group members shared their thoughts on this topic and how it plays out in their thoughts and feelings. [name] engaged in the discussion and attempted the strategies. Counselor introduced the concept of "Locus of Control" and shared a video explaining the activity they completed in assessing their sphere of influence and control in current stressful situations. Group members shared their  reflections. [name] processed his recent break up and struggled with some of the areas, but was able to process more verbally. Counselor checked in with all group members to share their plans for the afternoon and their overall mood. [name] plans to start packing to return home. He presented with moderate to mild depression and anxiety.    Assessment and Plan: Counselor recommends that patient remains in IOP treatment to better manage mental health symptoms and continue to address treatment plan goals. Counselor recommends adherence to crisis/safety plan, taking medications as prescribed and following up with medical professionals if any issues arise.   Follow Up Instructions: Counselor will send Webex link for next session.    I discussed the assessment and treatment plan with the patient. The patient was provided an opportunity to ask questions and all were answered. The patient agreed with the plan and demonstrated an understanding of the instructions.   The patient was advised to call back or seek an in-person evaluation if the symptoms worsen or if the condition fails to improve as anticipated.  I provided 180 minutes of non-face-to-face time during this encounter.   Lise Auer, LCSW

## 2018-12-20 NOTE — Progress Notes (Signed)
Virtual Visit via Telephone Note  I connected with Kirk Leon on 12/20/18 at  9:00 AM EDT by telephone and verified that I am speaking with the correct person using two identifiers.   I discussed the limitations, risks, security and privacy concerns of performing an evaluation and management service by telephone and the availability of in person appointments. I also discussed with the patient that there may be a patient responsible charge related to this service. The patient expressed understanding and agreed to proceed.  I discussed the assessment and treatment plan with the patient. The patient was provided an opportunity to ask questions and all were answered. The patient agreed with the plan and demonstrated an understanding of the instructions.   The patient was advised to call back or seek an in-person evaluation if the symptoms worsen or if the condition fails to improve as anticipated.   I provided 15 minutes of non-face-to-face time during this encounter.   Kirk Rackanika N Lewis, NP    BH MD/PA/NP OP Progress Note  12/20/2018 9:34 AM Kirk Leon  MRN:  [id number]   Kirk Leon continues to report side effects of headache,nausea and upset stomach which he contributes to medication doses.  Reported his symptoms have subsided during this assessment on 12/20/2018 after decreasing is medication on yesterday. Kirk Leon stated he is feeling "much better today".  Denying suicidal or homicidal ideations.  Denies auditory or visual hallucinations.  Kirk Leon continues to appear medication focused with with multiple somatic complaints daily.  NP to follow-up with attending psychiatrist regarding medication dosing change reported by patient.  Reports he is currently taking Wellbutrin 300 and Seroquel 25 mg and is feeling much better after he adjusted his medications.    Visit Diagnosis:    ICD-10-CM   1. Severe episode of recurrent major depressive disorder, without psychotic features (HCC)  F33.2    2. GAD (generalized anxiety disorder)  F41.1     Past Psychiatric History:  Past Medical History:  Past Medical History:  Diagnosis Date  . Anxiety   . Asthma   . Depression   . Seasonal allergies     Past Surgical History:  Procedure Laterality Date  . APPENDECTOMY  2012    Family Psychiatric History:  Family History:  Family History  Problem Relation Age of Onset  . Depression Maternal Aunt     Social History:  Social History   Socioeconomic History  . Marital status: Single    Spouse name: Not on file  . Number of children: 0  . Years of education: Not on file  . Highest education level: Not on file  Occupational History  . Not on file  Social Needs  . Financial resource strain: Not hard at all  . Food insecurity    Worry: Never true    Inability: Never true  . Transportation needs    Medical: No    Non-medical: No  Tobacco Use  . Smoking status: Never Smoker  . Smokeless tobacco: Never Used  Substance and Sexual Activity  . Alcohol use: Yes    Comment: 4-5 mixed drinks 1-2 weekly  . Drug use: Yes    Types: Marijuana    Comment: uses on occasion  . Sexual activity: Not Currently  Lifestyle  . Physical activity    Days per week: 7 days    Minutes per session: 30 min  . Stress: Very much  Relationships  . Social connections    Talks on phone: More than three times a  week    Gets together: More than three times a week    Attends religious service: More than 4 times per year    Active member of club or organization: Yes    Attends meetings of clubs or organizations: More than 4 times per year    Relationship status: Never married  Other Topics Concern  . Not on file  Social History Narrative  . Not on file    Allergies:  Allergies  Allergen Reactions  . Sulfa Antibiotics Hives    Metabolic Disorder Labs: Lab Results  Component Value Date   HGBA1C 4.8 11/28/2018   Lab Results  Component Value Date   PROLACTIN 14.1 11/28/2018    Lab Results  Component Value Date   CHOL 140 11/28/2018   TRIG 93 11/28/2018   HDL 52 11/28/2018   LDLCALC 71 11/28/2018   Lab Results  Component Value Date   TSH 1.090 11/28/2018    Therapeutic Level Labs: No results found for: LITHIUM No results found for: VALPROATE No components found for:  CBMZ  Current Medications: Current Outpatient Medications  Medication Sig Dispense Refill  . buPROPion (WELLBUTRIN XL) 300 MG 24 hr tablet Take 1 tablet (300 mg total) by mouth every morning. (Patient taking differently: Take 300 mg by mouth every morning. Pt has been increased to 450 mg) 30 tablet 2  . hydrOXYzine (ATARAX/VISTARIL) 25 MG tablet Take 1 tablet (25 mg total) by mouth 3 (three) times daily as needed for anxiety. 42 tablet 0  . QUEtiapine (SEROQUEL) 50 MG tablet Take 1 tablet (50 mg total) by mouth at bedtime. (Patient taking differently: Take 50 mg by mouth 3 (three) times daily. Increased to 25 mg TID) 30 tablet 0  . traZODone (DESYREL) 50 MG tablet Take 50 mg by mouth at bedtime.    . traZODone (DESYREL) 50 MG tablet Take 1 tablet (50 mg total) by mouth at bedtime. 30 tablet 0  . zolpidem (AMBIEN CR) 12.5 MG CR tablet Take 1 tablet (12.5 mg total) by mouth at bedtime as needed for sleep. 30 tablet 1   No current facility-administered medications for this visit.      Musculoskeletal:   Psychiatric Specialty Exam: ROS  There were no vitals taken for this visit.There is no height or weight on file to calculate BMI.  General Appearance: Casual  Eye Contact:  Fair  Speech:  Clear and Coherent  Volume:  Normal  Mood:  Anxious  Affect:  Congruent  Thought Process:  Coherent  Orientation:  Full (Time, Place, and Person)  Thought Content: Logical   Suicidal Thoughts:  No  Homicidal Thoughts:  No  Memory:  Immediate;   Fair Remote;   Fair  Judgement:  Fair  Insight:  Fair  Psychomotor Activity:  Normal  Concentration:  Concentration: Fair  Recall:  [employer] of  Knowledge: Fair  Language: Fair  Akathisia:  No  Handed:  Right  AIMS (if indicated):   Assets:  Communication Skills Desire for Improvement Resilience Social Support  ADL's:  Intact  Cognition: WNL  Sleep:  Fair   Screenings: AIMS     Ad[facility] (Discharged) [facility]  AIMS Total Score  0    AUDIT     Admission (Discharged) [facility]  Alcohol Use Disorder Identification Test Final Score (AUDIT)  2    PHQ2-9     Counselor  from 11/18/2018 in [place]  PHQ-2 Total Score  6  PHQ-9 Total Score  22       Assessment and Plan:  Continue [facility]  Outpatient  Programming  Keep follow-up with [name] and Therapist [facility]    - Continue Wellbutrin XL 300 mg daily - Continue Seroquel 25 mg nightly    Treatment plan was reviewed and agreed upon by NP.T.Bobby Rumpf and patient Kirk Leon need for group services.    [name], NP 12/20/2018, 9:34 AM

## 2018-12-20 NOTE — Progress Notes (Addendum)
   Virtual Visit via Telephone Note  I connected with Kirk Leon on 12/20/18 at  9:00 AM EDT by telephone and verified that I am speaking with the correct person using two identifiers.   I discussed the limitations, risks, security and privacy concerns of performing an evaluation and management service by telephone and the availability of in person appointments. I also discussed with the patient that there may be a patient responsible charge related to this service. The patient expressed understanding and agreed to proceed.   I discussed the assessment and treatment plan with the patient. The patient was provided an opportunity to ask questions and all were answered. The patient agreed with the plan and demonstrated an understanding of the instructions.   The patient was advised to call back or seek an in-person evaluation if the symptoms worsen or if the condition fails to improve as anticipated.  I provided 15 minutes of non-face-to-face time during this encounter.   [name], NP   [facility] Program Discharge Summary  Kirk Leon [id number]  Admission date:  12/07/2018 Discharge date: 12/20/2018  Reason for admission:Per admission assessment note-Kirk Rosenis a 28 y.o. Caucasianmale presents with depression and suicidal ideations.Currently denying intent or plan. Reports a longstanding history with depression and anxiety. Reports for the past 10 years he has been struggling with depression. Reports more recently he has experienced a break-up with his girlfriend of 1-1/2 years. Reports recent diagnosis with COVID. 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 Patient reports feeling " freeas he was [place]"reports feelings of hopelessness worthlessness. Reports intermittent bouts of depression. Patient is followed by [facility] psychiatrist who recently  initiated Wellbutrin and trazodone for reported symptoms.   Chemical Use History: Denied  Family of Origin Issues: Denied  Progress in Program Toward Treatment Goals: Ongoing, Patient attended and participation with daily group sessions with active in engaged participation.  Patient recently completed partial hospitalization programming.  Medication adjustments was made during his admission.  Denied suicidal or homicidal ideations.  Denies auditory or visual hallucinations.  [name] continues to report side effects of headache, nausea and upset stomach which he contributes to medication doses.  Reports he has recently titrated his medications downward.  Reports he is currently taking Wellbutrin 300 and Seroquel 25 mg and is feeling much better after he adjusted his medications.  Progress (rationale): Keep follow-up with [name], Follow-up with [facility] for therapy. 30-day supply will be provided Wellbutrin 300 mg and Seroquel 25 mg.    -Reported seeking psychiatrist in [place] as he has plans to return.     Take all medications as prescribed. Keep all follow-up appointments as scheduled.  Do not consume alcohol or use illegal drugs while on prescription medications. Report any adverse effects from your medications to your primary care provider promptly.  In the event of recurrent symptoms or worsening symptoms, call 911, a crisis hotline, or go to the nearest emergency department for evaluation.   [name], NP 12/20/2018

## 2018-12-21 ENCOUNTER — Other Ambulatory Visit (HOSPITAL_COMMUNITY): Payer: BLUE CROSS/BLUE SHIELD | Admitting: Psychiatry

## 2018-12-21 ENCOUNTER — Encounter (HOSPITAL_COMMUNITY): Payer: Self-pay | Admitting: Psychiatry

## 2018-12-21 ENCOUNTER — Other Ambulatory Visit: Payer: Self-pay

## 2018-12-21 DIAGNOSIS — F332 Major depressive disorder, recurrent severe without psychotic features: Secondary | ICD-10-CM

## 2018-12-21 DIAGNOSIS — F411 Generalized anxiety disorder: Secondary | ICD-10-CM

## 2018-12-21 NOTE — Progress Notes (Signed)
Virtual Visit via Video Note  I connected with Kirk Leon on 12/21/18 at  9:00 AM EDT by a video enabled telemedicine application and verified that I am speaking with the correct person using two identifiers.  Location: Patient: Kirk Leon Provider: Lise Auer, LCSW   I discussed the limitations of evaluation and management by telemedicine and the availability of in person appointments. The patient expressed understanding and agreed to proceed.  History of Present Illness: MDD and GAD   Observations/Objective: Case Manager checked in with all participants to review discharge dates, insurance authorizations, work-related documents and needs for the treatment team. Counselor processed current mood and functioning and discussed how participants spent their time since last session and if skills were applied. Kirk Leon shared that he connected with a friend to play basketball yesterday, something he has not done in months. He shared about the positive benefits. He shared that his physical symptoms have improved with the medication change. Kirk Leon presented with mild depression and mild anxiety. Counselor presented on and covered 3 websites which discussed coping skills for anxiety, depression and "stressed out adults." Group members shared feedback and identified a variety of coping skills they are willing to implement to address mental health symptoms. Kirk Leon identified that he would like to focus on doing more outdoor activities, being in nature, hiking, walking, sports, yoga in the park, video editing, playing guitar, massaging hands, forgiving himself, connecting with friends more, using body pillow for comfort, and accepting the gap between real self and ideal self. Counselor facilitated the group with a guided imagery script on "Becoming More Playful and Less Serious to expose them to deep breathing, progressive muscle relaxation and tension, positive affirmations, meditation and guided imagery. Group  members shared how the experience was for them. Kirk Leon shared that the practice made him realize how tired and sleepy he was. He commented on wanting to be less serious about life and enjoy more moments. In closing group members shared their plans for the afternoon. Kirk Leon plans to rest today and to continue planning for his transition back to [place].    Assessment and Plan: Counselor recommends that patient remains in IOP treatment to better manage mental health symptoms and continue to address treatment plan goals. Counselor recommends adherence to crisis/safety plan, taking medications as prescribed and following up with medical professionals if any issues arise.   Follow Up Instructions: Counselor will send Webex link for next session.    I discussed the assessment and treatment plan with the patient. The patient was provided an opportunity to ask questions and all were answered. The patient agreed with the plan and demonstrated an understanding of the instructions.   The patient was advised to call back or seek an in-person evaluation if the symptoms worsen or if the condition fails to improve as anticipated.  I provided 180 minutes of non-face-to-face time during this encounter.   Lise Auer, LCSW

## 2018-12-22 ENCOUNTER — Other Ambulatory Visit: Payer: Self-pay

## 2018-12-22 ENCOUNTER — Other Ambulatory Visit (HOSPITAL_COMMUNITY): Payer: BLUE CROSS/BLUE SHIELD | Admitting: Psychiatry

## 2018-12-22 DIAGNOSIS — F332 Major depressive disorder, recurrent severe without psychotic features: Secondary | ICD-10-CM

## 2018-12-22 DIAGNOSIS — F411 Generalized anxiety disorder: Secondary | ICD-10-CM

## 2018-12-22 DIAGNOSIS — R4589 Other symptoms and signs involving emotional state: Secondary | ICD-10-CM

## 2018-12-22 NOTE — Patient Instructions (Signed)
D:  Patient completed MH-IOP today (12-22-18).  A:  D/C today.  Will follow up with Dr. Casimiro Needle (patient to schedule appt for either 12-23-18 or 12-26-18) and Dr. Lenora Boys 12-23-18 @ 3pm.  Encouraged support groups.  R: Patient receptive.

## 2018-12-22 NOTE — Progress Notes (Signed)
Virtual Visit via Video Note  I connected with Kirk Leon on 12/22/18 at  9:00 AM EDT by a video enabled telemedicine application and verified that I am speaking with the correct person using two identifiers.  I discussed the limitations of evaluation and management by telemedicine and the availability of in person appointments. The patient expressed understanding and agreed to proceed. I discussed the assessment and treatment plan with the patient. The patient was provided an opportunity to ask questions and all were answered. The patient agreed with the plan and demonstrated an understanding of the instructions.  The patient was advised to call back or seek an in-person evaluation if the symptoms worsen or if the condition fails to improve as anticipated.  I provided 20 minutes of non-face-to-face time during this encounter.   As per previous CCA: Pt presents as referral from [facility]. Pt reports walking in to [facility] per the recommendation of psychiatrist, Dr Casimiro Needle. Pt states he expressed suicidal thoughts to his father, wishes that the plane pt he was due to be on would crash and he would die. Pt was flying from [place] to where he lives in [place]. Pt reports his father had him fly home to [place] instead and his father arranged for him to see the psychiatrist. Pt reports "a build up of things" over the past month and a half led to where he is now. Pt shares he went on a van trip through many of the [place] parks and felt "truly happy" consistently for the first time in approximately 10 years. Pt reports he had been feeling "numb" and confusing it for "being okay" and the trip showed him he was wrong. Pt reports conflicting issues with an off and on again girlfriend. Pt identifies that when he had his trip revelation he put his energy into his relationship instead, to distract him. Last week he went to visit his girlfriend in [place] and she broke up with him the day he arrived . Pt shares that  without the distraction of the relationship everything came "crashing in" and he got into a "dark hole." Pt reports realizing he has been depressed for 10 years and overall unfulfilled in life. Pt states additional complication of his mom being sick with chronic leukemia. Pt shares he goes to bed most nights hoping he does not wake up. Pt reports he thinks of suicide daily and this is not new for him and states he does not think about acting on his thoughts and has deterrents of his friends and family, who he would not want to cause pain to. Pt was discharged from [facility] due to denial of SI and testing positive for COVID and desiring to quarantine at home. Pt reports he was started on medication at [facility] and has been taking it and denies negative side effects. Patients Currently Reported Symptoms/Problems: Pt reports depressed mood, lethargy, lack of motivation, rumination, decrease in appetite, passive SI, and panic attacks with symptoms of shortness of breath, heart palipitations, racing thoughts, and feeling warm  Pt transitioned to [place] from [place] 12-07-18.  Reports feeling a "little better."  States he's taking his meds daily.  Denies HI or A/V hallucinations, but admits to passive SI. Continues to be grieving the loss of his relationship with girlfriend.    Pt completed all scheduled days except for one day d/t illness.  Since starting MH-IOP, pt has been focusing on somatic complaints daily; but since medication was adjusted pt states he is feeling much better.  Reports decreased anxiety.  "I feel like the medications are helping me now."  Denies SI/HI or A/V hallucinations.  States he is planning to return to [place] soon.  A:  D/C today.  F/U with Dr. Donell Beers (pt states he has a call out for an appt) and Dr. Elijah Birk Hedding 12-23-18 @ 3pm.  Encouraged support groups.  R:  Patient receptive.     Jeri Modena, M.Ed,CNA

## 2018-12-23 ENCOUNTER — Encounter (HOSPITAL_COMMUNITY): Payer: Self-pay

## 2018-12-23 ENCOUNTER — Other Ambulatory Visit: Payer: Self-pay

## 2018-12-23 ENCOUNTER — Other Ambulatory Visit (HOSPITAL_COMMUNITY): Payer: BLUE CROSS/BLUE SHIELD | Admitting: Psychiatry

## 2018-12-23 DIAGNOSIS — F411 Generalized anxiety disorder: Secondary | ICD-10-CM

## 2018-12-23 DIAGNOSIS — F332 Major depressive disorder, recurrent severe without psychotic features: Secondary | ICD-10-CM

## 2018-12-23 NOTE — Progress Notes (Signed)
Virtual Visit via Video Note  I connected with Kirk Leon on 12/23/18 at  9:00 AM EDT by a video enabled telemedicine application and verified that I am speaking with the correct person using two identifiers.  Location: Patient: Kirk Leon Provider: Lise Auer, LCSW   I discussed the limitations of evaluation and management by telemedicine and the availability of in person appointments. The patient expressed understanding and agreed to proceed.  History of Present Illness: MDD and GAD    Observations/Objective: Case Manager checked in with all participants to review discharge dates, insurance authorizations, work-related documents and needs for the treatment team. Counselor engaged the group by checking in to determine who completed their self-care act and to see everyone's current state and mood. Kirk Leon shared that he is doing much better physically, less issues with headaches and stomach aches. He spent time traveling with his family yesterday and the interactions were not triggering for him. Kirk Leon presented with mild depression and mild anxiety today, more confident and more sure of himself. Counselor introduced guest speaker, Jeanella Craze, Lead [employer], to facilitate a discussion around Grief and Loss topics. Kirk Leon gave feedback on his current situations related to loss. Counselor prompted the group to journal about the G&L issues they would like to further process with their individual therapist. Counselor spent time allowing group members to share encouraging and inspirational words for Kirk Leon, as he is graduating today from IOP. Kirk Leon shared about his progress in treatment, his takeaways, was grateful for the well wishes and shared about his future plans. Counselor introduced Field seismologist, Jan Fireman, Yoga Instructor, to guide the group in a yoga practice. Counselor checked in with all participants to assess the benefits. Kirk Leon shared that he became zoned out during the practice,  because it was slowed paced yoga than what he is used to. Counselor closed by having group members share a self-care task and productivity activity they will do today. Kirk Leon shared that he planned to walk outdoors today to sit and reflect on this chapter of his life and his steps in moving forward. All wished him well on his journey.   Assessment and Plan: Counselor recommends that Kirk Leon step down from  IOP treatment to individual therapy and medication management to continue management of mental health symptoms and to address treatment plan goals. Counselor recommends adherence to crisis/safety plan, taking medications as prescribed and following up with medical professionals if any issues arise.   Follow Up Instructions:  I discussed the assessment and treatment plan with the patient. The patient was provided an opportunity to ask questions and all were answered. The patient agreed with the plan and demonstrated an understanding of the instructions.   The patient was advised to call back or seek an in-person evaluation if the symptoms worsen or if the condition fails to improve as anticipated.  I provided 180 minutes of non-face-to-face time during this encounter.   Lise Auer, LCSW
